# Patient Record
Sex: Female | Born: 1975 | Race: Black or African American | Hispanic: No | Smoking: Never smoker
Health system: Southern US, Community
[De-identification: ages and names within clinical notes are randomized; demographics above are authoritative.]

## PROBLEM LIST (undated history)

## (undated) DIAGNOSIS — F32A Depression, unspecified: Secondary | ICD-10-CM

## (undated) DIAGNOSIS — Z789 Other specified health status: Secondary | ICD-10-CM

## (undated) DIAGNOSIS — F419 Anxiety disorder, unspecified: Secondary | ICD-10-CM

## (undated) DIAGNOSIS — R011 Cardiac murmur, unspecified: Secondary | ICD-10-CM

## (undated) HISTORY — PX: NO PAST SURGERIES: SHX2092

## (undated) HISTORY — DX: Anxiety disorder, unspecified: F41.9

## (undated) HISTORY — DX: Other specified health status: Z78.9

## (undated) HISTORY — DX: Depression, unspecified: F32.A

## (undated) HISTORY — DX: Cardiac murmur, unspecified: R01.1

---

## 2007-05-14 ENCOUNTER — Emergency Department (HOSPITAL_COMMUNITY): Admission: EM | Admit: 2007-05-14 | Discharge: 2007-05-14 | Payer: Self-pay | Admitting: Emergency Medicine

## 2008-08-22 ENCOUNTER — Encounter: Admission: RE | Admit: 2008-08-22 | Discharge: 2008-08-22 | Payer: Self-pay | Admitting: Family Medicine

## 2010-03-28 ENCOUNTER — Encounter: Admission: RE | Admit: 2010-03-28 | Discharge: 2010-03-28 | Payer: Self-pay | Admitting: Family Medicine

## 2012-02-22 ENCOUNTER — Other Ambulatory Visit: Payer: Self-pay | Admitting: Family Medicine

## 2012-02-22 DIAGNOSIS — Z1231 Encounter for screening mammogram for malignant neoplasm of breast: Secondary | ICD-10-CM

## 2012-03-07 ENCOUNTER — Ambulatory Visit
Admission: RE | Admit: 2012-03-07 | Discharge: 2012-03-07 | Disposition: A | Discharge status: Home / Self Care | Payer: Medicaid Other | Source: Ambulatory Visit | Attending: Family Medicine | Admitting: Family Medicine

## 2012-03-07 DIAGNOSIS — Z1231 Encounter for screening mammogram for malignant neoplasm of breast: Secondary | ICD-10-CM

## 2015-06-11 LAB — OB RESULTS CONSOLE ABO/RH: RH Type: POSITIVE

## 2015-06-11 LAB — OB RESULTS CONSOLE HIV ANTIBODY (ROUTINE TESTING): HIV: NONREACTIVE

## 2015-06-11 LAB — OB RESULTS CONSOLE ANTIBODY SCREEN: ANTIBODY SCREEN: NEGATIVE

## 2015-06-11 LAB — OB RESULTS CONSOLE HGB/HCT, BLOOD
HCT: 38 %
Hemoglobin: 12.9 g/dL

## 2015-06-11 LAB — OB RESULTS CONSOLE PLATELET COUNT: PLATELETS: 411 10*3/uL

## 2015-06-11 LAB — OB RESULTS CONSOLE RPR: RPR: NONREACTIVE

## 2015-06-11 LAB — OB RESULTS CONSOLE HEPATITIS B SURFACE ANTIGEN: HEP B S AG: NEGATIVE

## 2015-06-11 LAB — OB RESULTS CONSOLE RUBELLA ANTIBODY, IGM: RUBELLA: IMMUNE

## 2015-07-01 ENCOUNTER — Encounter: Payer: Self-pay | Admitting: Obstetrics

## 2015-07-01 ENCOUNTER — Ambulatory Visit (INDEPENDENT_AMBULATORY_CARE_PROVIDER_SITE_OTHER): Payer: BLUE CROSS/BLUE SHIELD | Admitting: Obstetrics

## 2015-07-01 VITALS — BP 111/72 | HR 83 | Temp 99.2°F | Ht 65.5 in | Wt 233.0 lb

## 2015-07-01 DIAGNOSIS — O09521 Supervision of elderly multigravida, first trimester: Secondary | ICD-10-CM

## 2015-07-01 LAB — POCT URINALYSIS DIPSTICK
Bilirubin, UA: NEGATIVE
Glucose, UA: NEGATIVE
KETONES UA: NEGATIVE
LEUKOCYTES UA: NEGATIVE
Nitrite, UA: NEGATIVE
PH UA: 7
PROTEIN UA: NEGATIVE
RBC UA: NEGATIVE
Spec Grav, UA: 1.01
Urobilinogen, UA: NEGATIVE

## 2015-07-01 NOTE — Progress Notes (Signed)
Subjective:    Barbara Morales is being seen today for her first obstetrical visit.  This is a planned pregnancy. She is at [redacted]w[redacted]d gestation. Her obstetrical history is significant for advanced maternal age. Relationship with FOB: spouse, living together. Patient does intend to breast feed. Pregnancy history fully reviewed.  The information documented in the HPI was reviewed and verified.  Menstrual History: OB History    Gravida Para Term Preterm AB TAB SAB Ectopic Multiple Living   4 3 3       3        Patient's last menstrual period was 04/10/2015.    Past Medical History  Diagnosis Date  . Medical history non-contributory     Past Surgical History  Procedure Laterality Date  . No past surgeries       (Not in a hospital admission) Allergies  Allergen Reactions  . Penicillins Hives    Social History  Substance Use Topics  . Smoking status: Never Smoker   . Smokeless tobacco: Never Used  . Alcohol Use: No    History reviewed. No pertinent family history.   Review of Systems Constitutional: negative for weight loss Gastrointestinal: negative for vomiting Genitourinary:negative for genital lesions and vaginal discharge and dysuria Musculoskeletal:negative for back pain Behavioral/Psych: negative for abusive relationship, depression, illegal drug usage and tobacco use    Objective:    BP 111/72 mmHg  Pulse 83  Temp(Src) 99.2 F (37.3 C)  Ht 5' 5.5" (1.664 m)  Wt 233 lb (105.688 kg)  BMI 38.17 kg/m2  LMP 04/10/2015 General Appearance:    Alert, cooperative, no distress, appears stated age  Head:    Normocephalic, without obvious abnormality, atraumatic  Eyes:    PERRL, conjunctiva/corneas clear, EOM's intact, fundi    benign, both eyes  Ears:    Normal TM's and external ear canals, both ears  Nose:   Nares normal, septum midline, mucosa normal, no drainage    or sinus tenderness  Throat:   Lips, mucosa, and tongue normal; teeth and gums normal  Neck:   Supple,  symmetrical, trachea midline, no adenopathy;    thyroid:  no enlargement/tenderness/nodules; no carotid   bruit or JVD  Back:     Symmetric, no curvature, ROM normal, no CVA tenderness  Lungs:     Clear to auscultation bilaterally, respirations unlabored  Chest Wall:    No tenderness or deformity   Heart:    Regular rate and rhythm, S1 and S2 normal, no murmur, rub   or gallop  Breast Exam:    No tenderness, masses, or nipple abnormality  Abdomen:     Soft, non-tender, bowel sounds active all four quadrants,    no masses, no organomegaly  Genitalia:    Normal female without lesion, discharge or tenderness  Extremities:   Extremities normal, atraumatic, no cyanosis or edema  Pulses:   2+ and symmetric all extremities  Skin:   Skin color, texture, turgor normal, no rashes or lesions  Lymph nodes:   Cervical, supraclavicular, and axillary nodes normal  Neurologic:   CNII-XII intact, normal strength, sensation and reflexes    throughout      Lab Review Urine pregnancy test Labs reviewed yes Radiologic studies reviewed no   Assessment:    Pregnancy at [redacted]w[redacted]d weeks    AMA   Plan:    Referred to MFM for Genetic Counseling and Fetal Nuchal Fold Measurement and Ultrasound  Prenatal vitamins.  Counseling provided regarding continued use of seat belts, cessation of alcohol  consumption, smoking or use of illicit drugs; infection precautions i.e., influenza/TDAP immunizations, toxoplasmosis,CMV, parvovirus, listeria and varicella; workplace safety, exercise during pregnancy; routine dental care, safe medications, sexual activity, hot tubs, saunas, pools, travel, caffeine use, fish and methlymercury, potential toxins, hair treatments, varicose veins Weight gain recommendations per IOM guidelines reviewed: underweight/BMI< 18.5--> gain 28 - 40 lbs; normal weight/BMI 18.5 - 24.9--> gain 25 - 35 lbs; overweight/BMI 25 - 29.9--> gain 15 - 25 lbs; obese/BMI >30->gain  11 - 20 lbs Problem list  reviewed and updated. FIRST/CF mutation testing/NIPT/QUAD SCREEN/fragile X/Ashkenazi Jewish population testing/Spinal muscular atrophy discussed: requested. Role of ultrasound in pregnancy discussed; fetal survey: requested. Amniocentesis discussed: not indicated. VBAC calculator score: VBAC consent form provided  Meds ordered this encounter  Medications  . Prenatal Vit-Fe Fumarate-FA (PRENATAL VITAMINS PLUS) 27-1 MG TABS    Sig: Take by mouth.  . Doxylamine-Pyridoxine (DICLEGIS) 10-10 MG TBEC    Sig: Take by mouth.   Orders Placed This Encounter  Procedures  . SureSwab, Vaginosis/Vaginitis Plus  . [place] [facility] Fetal Nuchal Translucency    Standing Status: Future     Number of Occurrences:      Standing Expiration Date: 08/28/2016    Order Specific Question:  Reason for Exam (SYMPTOM  OR DIAGNOSIS REQUIRED)    Answer:  AMA    Order Specific Question:  Preferred imaging location?    Answer:  [facility]  . [place] [facility] OB Comp Less 14 Wks    Standing Status: Future     Number of Occurrences:      Standing Expiration Date: 08/28/2016    Order Specific Question:  Reason for Exam (SYMPTOM  OR DIAGNOSIS REQUIRED)    Answer:  AMA    Order Specific Question:  Preferred imaging location?    Answer:  [facility]  . AMB referral to maternal fetal medicine    Referral Priority:  Routine    Referral Type:  Consultation    Referral Reason:  Specialty Services Required    Number of Visits Requested:  1  . AMB MFM GENETICS REFERRAL    Referral Priority:  Routine    Referral Type:  Consultation    Referral Reason:  Specialty Services Required    Number of Visits Requested:  1    Follow up in 4 weeks.

## 2015-07-01 NOTE — Addendum Note (Signed)
Addended by: Carole Binning on: 07/01/2015 04:40 PM   Modules accepted: Orders

## 2015-07-04 ENCOUNTER — Other Ambulatory Visit: Payer: Self-pay | Admitting: Obstetrics

## 2015-07-04 LAB — SURESWAB, VAGINOSIS/VAGINITIS PLUS
Atopobium vaginae: NOT DETECTED Log (cells/mL)
C. ALBICANS, DNA: NOT DETECTED
C. GLABRATA, DNA: NOT DETECTED
C. TROPICALIS, DNA: NOT DETECTED
C. parapsilosis, DNA: DETECTED — AB
C. trachomatis RNA, TMA: NOT DETECTED
LACTOBACILLUS SPECIES: 7.7 Log (cells/mL)
MEGASPHAERA SPECIES: NOT DETECTED Log (cells/mL)
N. gonorrhoeae RNA, TMA: NOT DETECTED
T. VAGINALIS RNA, QL TMA: NOT DETECTED

## 2015-07-08 ENCOUNTER — Encounter (HOSPITAL_COMMUNITY): Payer: Self-pay

## 2015-07-08 ENCOUNTER — Ambulatory Visit (HOSPITAL_COMMUNITY)
Admission: RE | Admit: 2015-07-08 | Discharge: 2015-07-08 | Disposition: A | Discharge status: Home / Self Care | Payer: BLUE CROSS/BLUE SHIELD | Source: Ambulatory Visit | Attending: Obstetrics | Admitting: Obstetrics

## 2015-07-08 DIAGNOSIS — Z3A13 13 weeks gestation of pregnancy: Secondary | ICD-10-CM | POA: Insufficient documentation

## 2015-07-08 DIAGNOSIS — O09521 Supervision of elderly multigravida, first trimester: Secondary | ICD-10-CM | POA: Insufficient documentation

## 2015-07-08 DIAGNOSIS — Z36 Encounter for antenatal screening of mother: Secondary | ICD-10-CM | POA: Diagnosis not present

## 2015-07-08 DIAGNOSIS — O09529 Supervision of elderly multigravida, unspecified trimester: Secondary | ICD-10-CM

## 2015-07-09 DIAGNOSIS — O09529 Supervision of elderly multigravida, unspecified trimester: Secondary | ICD-10-CM | POA: Insufficient documentation

## 2015-07-09 NOTE — Progress Notes (Signed)
Genetic Counseling  High-Risk Gestation Note  Appointment Date:  07/08/2015 Referred By: Shelly Bombard, MD Date of Birth:  Sep 22, 1975 Partner:  Barbara Morales   Pregnancy HistoryWW:[id number] Estimated Date of Delivery: 01/15/16 Estimated Gestational Age: [redacted]w[redacted]d Attending: [place], MD   Mrs. Barbara Morales and her husband, Mr. Barbara Morales, were seen for genetic counseling because of a maternal age of 40 y.o..     In Summary:   Discussed approximate 1 in 29 risk for fetal aneuploidy related to maternal age  Nuchal translucency ultrasound attempted today; Results reported separately  Patient elected to pursue NIPS (Panorama) today; declined maternal serum screening and CVS/amniocentesis  Detailed ultrasound scheduled for 08/13/15  Family history significant for maternal great-grandfather with sickle cell anemia; Patient declined hemoglobin electrophoresis today.   They were counseled regarding maternal age and the association with risk for chromosome conditions due to nondisjunction with aging of the ova.  We reviewed chromosomes, nondisjunction, and the associated 1 in 1 risk for fetal aneuploidy related to a maternal age of 40 y.o. at [redacted]w[redacted]d gestation. They were counseled that the risk for aneuploidy decreases as gestational age increases, accounting for those pregnancies which spontaneously abort.  We specifically discussed Down syndrome (trisomy 28), trisomies 53 and 18, and sex chromosome aneuploidies (47,XXX and 47,XXY) including the common features and prognoses of each.   We reviewed available screening options including First Screen, Quad screen, noninvasive prenatal screening (NIPS)/cell free DNA (cfDNA) testing, and detailed ultrasound. They were counseled that screening tests are used to modify a patient's a priori risk for aneuploidy, typically based on age. This estimate provides a pregnancy specific risk assessment. We reviewed the benefits and limitations of each option.  Specifically, we discussed the conditions for which each test screens, the detection rates, and false positive rates of each. They were also counseled regarding diagnostic testing via CVS and amniocentesis. We reviewed the approximate 1 in [id number] risk for complications for amniocentesis, including spontaneous pregnancy loss. After consideration of all the options, she elected to proceed with NIPS (Panorama through [facility] laboratory).  Those results will be available in 8-10 days.  She declined first screen, Quad screen, and CVS/amniocentesis.   Nuchal translucency ultrasound was attempted today.  The report will be documented separately.  Detailed ultrasound is scheduled for 08/13/15.  They understand that screening tests cannot rule out all birth defects or genetic syndromes. The patient was advised of this limitation and states she still does not want additional testing at this time.   Both family histories were reviewed and found to be contributory for sickle cell anemia for the patient's maternal great-grandfather. She reported no additional relatives with sickle cell anemia and was not sure of her personal sickle cell carrier status. We discussed that given this reported family history, her chance to have sickle cell trait is 1 in 4. The father of the pregnancy reported no known family history of sickle cell disease or sickle cell trait.  Mrs. Barbara Morales was provided with written information regarding sickle cell anemia (SCA) including the carrier frequency and incidence in the African-American population, the availability of carrier testing and prenatal diagnosis if indicated.  In addition, we discussed that hemoglobinopathies are routinely screened for as part of the [place] newborn screening panel.  She declined hemoglobin electrophoresis today. Without further information regarding the provided family history, an accurate genetic risk cannot be calculated. Further genetic counseling is warranted if more  information is obtained.  Mrs. Barbara Morales denied exposure to environmental  toxins or chemical agents. She denied the use of alcohol, tobacco or street drugs. She denied significant viral illnesses during the course of her pregnancy. Her medical and surgical histories were noncontributory.   I counseled this couple regarding the above risks and available options.  The approximate face-to-face time with the genetic counselor was 40 minutes.  Chipper Oman, MS,  Certified Genetic Counselor 07/09/2015

## 2015-07-10 ENCOUNTER — Encounter: Payer: Self-pay | Admitting: *Deleted

## 2015-07-10 DIAGNOSIS — O09521 Supervision of elderly multigravida, first trimester: Secondary | ICD-10-CM

## 2015-07-16 ENCOUNTER — Other Ambulatory Visit (HOSPITAL_COMMUNITY): Payer: Self-pay

## 2015-07-16 ENCOUNTER — Telehealth (HOSPITAL_COMMUNITY): Payer: Self-pay | Admitting: MS"

## 2015-07-16 NOTE — Telephone Encounter (Signed)
Called Barbara Morales to discuss her prenatal cell free DNA test results.  Mrs. Barbara Morales had Panorama testing through [facility] [facility].  Testing was offered because of maternal age.   The patient was identified by name and DOB.  We reviewed that these are within normal limits, showing a less than 1 in 10,000 risk for trisomies 21, 18 and 13, and monosomy X (Turner syndrome).  In addition, the risk for triploidy/vanishing twin and sex chromosome trisomies (47,XXX and 47,XXY) was also low risk.  We reviewed that this testing identifies > 99% of pregnancies with trisomy 72, trisomy 47, sex chromosome trisomies (47,XXX and 47,XXY), and triploidy. The detection rate for trisomy 18 is 96%.  The detection rate for monosomy X is ~92%.  The false positive rate is <0.1% for all conditions. Testing was also consistent with female fetal sex.  The patient did wish to know fetal sex.  She understands that this testing does not identify all genetic conditions.  All questions were answered to her satisfaction, she was encouraged to call with additional questions or concerns.  Chipper Oman, MS Certified Genetic Counselor 07/16/2015 12:39 PM

## 2015-07-22 ENCOUNTER — Ambulatory Visit (INDEPENDENT_AMBULATORY_CARE_PROVIDER_SITE_OTHER): Payer: BLUE CROSS/BLUE SHIELD | Admitting: Obstetrics

## 2015-07-22 VITALS — BP 104/73 | HR 82 | Temp 99.0°F | Wt 233.0 lb

## 2015-07-22 DIAGNOSIS — Z3492 Encounter for supervision of normal pregnancy, unspecified, second trimester: Secondary | ICD-10-CM

## 2015-07-22 LAB — POCT URINALYSIS DIPSTICK
BILIRUBIN UA: NEGATIVE
Glucose, UA: NEGATIVE
KETONES UA: NEGATIVE
Leukocytes, UA: NEGATIVE
Nitrite, UA: NEGATIVE
PH UA: 7
Protein, UA: NEGATIVE
RBC UA: NEGATIVE
Spec Grav, UA: 1.015
Urobilinogen, UA: NEGATIVE

## 2015-07-22 NOTE — Progress Notes (Signed)
Patient is doing some better with her nausea.

## 2015-07-23 ENCOUNTER — Encounter: Payer: Self-pay | Admitting: Obstetrics

## 2015-07-23 NOTE — Progress Notes (Signed)
  Subjective:    Barbara Morales is a 40 y.o. female being seen today for her obstetrical visit. She is at [redacted]w[redacted]d gestation. Patient reports: no complaints.  Problem List Items Addressed This Visit    None    Visit Diagnoses    Prenatal care, second trimester    -  Primary    Relevant Orders    POCT urinalysis dipstick (Completed)      Patient Active Problem List   Diagnosis Date Noted  . Advanced maternal age in multigravida 07/09/2015    Objective:     BP 104/73 mmHg  Pulse 82  Temp(Src) 99 F (37.2 C)  Wt 233 lb (105.688 kg)  LMP 04/10/2015 Uterine Size: Below umbilicus     Assessment:    Pregnancy @ [redacted]w[redacted]d  weeks Doing well    Plan:    Problem list reviewed and updated. Labs reviewed.  Follow up in 4 weeks. FIRST/CF mutation testing/NIPT/QUAD SCREEN/fragile X/Ashkenazi Jewish population testing/Spinal muscular atrophy discussed: requested. Role of ultrasound in pregnancy discussed; fetal survey: requested. Amniocentesis discussed: not indicated.

## 2015-07-24 ENCOUNTER — Telehealth: Payer: Self-pay | Admitting: *Deleted

## 2015-07-24 NOTE — Telephone Encounter (Signed)
Diclegis Rx has been approved via Medicaid.  Pharmacy aware.

## 2015-08-13 ENCOUNTER — Encounter (HOSPITAL_COMMUNITY): Payer: Self-pay

## 2015-08-13 ENCOUNTER — Ambulatory Visit (HOSPITAL_COMMUNITY)
Admission: RE | Admit: 2015-08-13 | Discharge: 2015-08-13 | Disposition: A | Discharge status: Home / Self Care | Payer: BLUE CROSS/BLUE SHIELD | Source: Ambulatory Visit | Attending: Specialist | Admitting: Specialist

## 2015-08-13 ENCOUNTER — Other Ambulatory Visit (HOSPITAL_COMMUNITY): Payer: Self-pay | Admitting: Obstetrics and Gynecology

## 2015-08-13 DIAGNOSIS — Z3A17 17 weeks gestation of pregnancy: Secondary | ICD-10-CM

## 2015-08-13 DIAGNOSIS — Z36 Encounter for antenatal screening of mother: Secondary | ICD-10-CM | POA: Diagnosis present

## 2015-08-13 DIAGNOSIS — O99212 Obesity complicating pregnancy, second trimester: Secondary | ICD-10-CM | POA: Insufficient documentation

## 2015-08-13 DIAGNOSIS — Z1389 Encounter for screening for other disorder: Secondary | ICD-10-CM

## 2015-08-13 DIAGNOSIS — O09522 Supervision of elderly multigravida, second trimester: Secondary | ICD-10-CM | POA: Insufficient documentation

## 2015-08-13 DIAGNOSIS — O09529 Supervision of elderly multigravida, unspecified trimester: Secondary | ICD-10-CM

## 2015-08-13 DIAGNOSIS — O09523 Supervision of elderly multigravida, third trimester: Secondary | ICD-10-CM

## 2015-08-14 ENCOUNTER — Other Ambulatory Visit (HOSPITAL_COMMUNITY): Payer: Self-pay | Admitting: *Deleted

## 2015-08-14 DIAGNOSIS — Z0489 Encounter for examination and observation for other specified reasons: Secondary | ICD-10-CM

## 2015-08-14 DIAGNOSIS — IMO0002 Reserved for concepts with insufficient information to code with codable children: Secondary | ICD-10-CM

## 2015-08-19 ENCOUNTER — Encounter: Payer: Self-pay | Admitting: Obstetrics

## 2015-08-19 ENCOUNTER — Ambulatory Visit (INDEPENDENT_AMBULATORY_CARE_PROVIDER_SITE_OTHER): Payer: BLUE CROSS/BLUE SHIELD | Admitting: Obstetrics

## 2015-08-19 VITALS — BP 104/69 | HR 99 | Temp 98.4°F | Wt 233.0 lb

## 2015-08-19 DIAGNOSIS — Z3482 Encounter for supervision of other normal pregnancy, second trimester: Secondary | ICD-10-CM

## 2015-08-19 LAB — POCT URINALYSIS DIPSTICK
BILIRUBIN UA: NEGATIVE
Glucose, UA: NEGATIVE
KETONES UA: NEGATIVE
Leukocytes, UA: NEGATIVE
Nitrite, UA: NEGATIVE
PH UA: 5
Protein, UA: NEGATIVE
RBC UA: NEGATIVE
Spec Grav, UA: 1.02
Urobilinogen, UA: NEGATIVE

## 2015-08-19 NOTE — Progress Notes (Signed)
Subjective:    Barbara Morales is a 40 y.o. female being seen today for her obstetrical visit. She is at [redacted]w[redacted]d gestation. Patient reports: no complaints . Fetal movement: normal.  Problem List Items Addressed This Visit    None    Visit Diagnoses    Supervision of other normal pregnancy, antepartum, second trimester    -  Primary    Relevant Orders    POCT urinalysis dipstick (Completed)      Patient Active Problem List   Diagnosis Date Noted  . Advanced maternal age in multigravida 07/09/2015   Objective:    BP 104/69 mmHg  Pulse 99  Temp(Src) 98.4 F (36.9 C)  Wt 233 lb (105.688 kg)  LMP 04/10/2015 FHT: 150 BPM  Uterine Size: size equals dates     Assessment:    Pregnancy @ [redacted]w[redacted]d    Plan:    Signs and symptoms of preterm labor: discussed.  Labs, problem list reviewed and updated 2 hr GTT planned Follow up in 4 weeks.

## 2015-09-10 ENCOUNTER — Other Ambulatory Visit: Payer: Self-pay | Admitting: Certified Nurse Midwife

## 2015-09-16 ENCOUNTER — Ambulatory Visit (INDEPENDENT_AMBULATORY_CARE_PROVIDER_SITE_OTHER): Payer: BLUE CROSS/BLUE SHIELD | Admitting: Obstetrics

## 2015-09-16 ENCOUNTER — Encounter: Payer: Self-pay | Admitting: Obstetrics

## 2015-09-16 VITALS — BP 111/72 | HR 84 | Wt 239.0 lb

## 2015-09-16 DIAGNOSIS — Z3492 Encounter for supervision of normal pregnancy, unspecified, second trimester: Secondary | ICD-10-CM

## 2015-09-16 LAB — POCT URINALYSIS DIPSTICK
BILIRUBIN UA: NEGATIVE
Glucose, UA: NEGATIVE
Ketones, UA: NEGATIVE
LEUKOCYTES UA: NEGATIVE
NITRITE UA: NEGATIVE
PH UA: 5
RBC UA: NEGATIVE
Spec Grav, UA: 1.025
Urobilinogen, UA: NEGATIVE

## 2015-09-16 NOTE — Progress Notes (Signed)
Subjective:    Barbara Morales is a 40 y.o. female being seen today for her obstetrical visit. She is at [redacted]w[redacted]d gestation. Patient reports: no complaints . Fetal movement: normal.  Problem List Items Addressed This Visit    None    Visit Diagnoses    Prenatal care, second trimester    -  Primary    Relevant Orders    POCT urinalysis dipstick (Completed)      Patient Active Problem List   Diagnosis Date Noted  . Advanced maternal age in multigravida 07/09/2015   Objective:    BP 111/72 mmHg  Pulse 84  Wt 239 lb (108.41 kg)  LMP 04/10/2015 FHT: 150 BPM  Uterine Size: size equals dates     Assessment:    Pregnancy @ [redacted]w[redacted]d    Plan:    Signs and symptoms of preterm labor: discussed.  Labs, problem list reviewed and updated 2 hr GTT planned Follow up in 4 weeks.

## 2015-09-16 NOTE — Progress Notes (Signed)
Patient states she had some real sharp pain yesterday that lasted about 5 min.

## 2015-09-23 ENCOUNTER — Ambulatory Visit (HOSPITAL_COMMUNITY)
Admission: RE | Admit: 2015-09-23 | Discharge: 2015-09-23 | Disposition: A | Discharge status: Home / Self Care | Payer: BLUE CROSS/BLUE SHIELD | Source: Ambulatory Visit | Attending: Obstetrics | Admitting: Obstetrics

## 2015-09-23 ENCOUNTER — Encounter (HOSPITAL_COMMUNITY): Payer: Self-pay

## 2015-09-23 VITALS — BP 112/65 | HR 83 | Wt 239.2 lb

## 2015-09-23 DIAGNOSIS — O99212 Obesity complicating pregnancy, second trimester: Secondary | ICD-10-CM | POA: Diagnosis not present

## 2015-09-23 DIAGNOSIS — Z0489 Encounter for examination and observation for other specified reasons: Secondary | ICD-10-CM

## 2015-09-23 DIAGNOSIS — O09529 Supervision of elderly multigravida, unspecified trimester: Secondary | ICD-10-CM

## 2015-09-23 DIAGNOSIS — Z3A23 23 weeks gestation of pregnancy: Secondary | ICD-10-CM | POA: Insufficient documentation

## 2015-09-23 DIAGNOSIS — O4402 Placenta previa specified as without hemorrhage, second trimester: Secondary | ICD-10-CM | POA: Insufficient documentation

## 2015-09-23 DIAGNOSIS — O09522 Supervision of elderly multigravida, second trimester: Secondary | ICD-10-CM | POA: Diagnosis not present

## 2015-09-23 DIAGNOSIS — IMO0002 Reserved for concepts with insufficient information to code with codable children: Secondary | ICD-10-CM

## 2015-10-14 ENCOUNTER — Encounter: Payer: BLUE CROSS/BLUE SHIELD | Admitting: Obstetrics

## 2015-10-14 ENCOUNTER — Ambulatory Visit (INDEPENDENT_AMBULATORY_CARE_PROVIDER_SITE_OTHER): Payer: BLUE CROSS/BLUE SHIELD | Admitting: Obstetrics

## 2015-10-14 VITALS — BP 115/70 | HR 95 | Temp 98.1°F

## 2015-10-14 DIAGNOSIS — Z3492 Encounter for supervision of normal pregnancy, unspecified, second trimester: Secondary | ICD-10-CM

## 2015-10-14 DIAGNOSIS — O09521 Supervision of elderly multigravida, first trimester: Secondary | ICD-10-CM

## 2015-10-14 LAB — POCT URINALYSIS DIPSTICK
Bilirubin, UA: NEGATIVE
GLUCOSE UA: NEGATIVE
Ketones, UA: NEGATIVE
Leukocytes, UA: NEGATIVE
NITRITE UA: NEGATIVE
SPEC GRAV UA: 1.015
Urobilinogen, UA: NEGATIVE
pH, UA: 6

## 2015-10-14 NOTE — Progress Notes (Signed)
Patient has concerns about placenta moving

## 2015-10-15 ENCOUNTER — Encounter: Payer: Self-pay | Admitting: Obstetrics

## 2015-10-15 NOTE — Progress Notes (Signed)
Subjective:    Barbara Morales is a 40 y.o. female being seen today for her obstetrical visit. She is at [redacted]w[redacted]d gestation. Patient reports: no complaints . Fetal movement: normal.  Problem List Items Addressed This Visit    None    Visit Diagnoses    Prenatal care, second trimester    -  Primary    Relevant Orders    POCT urinalysis dipstick (Completed)      Patient Active Problem List   Diagnosis Date Noted  . Advanced maternal age in multigravida 07/09/2015   Objective:    BP 115/70 mmHg  Pulse 95  Temp(Src) 98.1 F (36.7 C)  LMP 04/10/2015 FHT: 150 BPM  Uterine Size: size equals dates     Assessment:    Pregnancy @ [redacted]w[redacted]d    Plan:    OBGCT: ordered for next visit. Signs and symptoms of preterm labor: discussed.  Labs, problem list reviewed and updated 2 hr GTT planned Follow up in 2 weeks.

## 2015-10-28 ENCOUNTER — Other Ambulatory Visit: Payer: BLUE CROSS/BLUE SHIELD

## 2015-10-28 ENCOUNTER — Ambulatory Visit (INDEPENDENT_AMBULATORY_CARE_PROVIDER_SITE_OTHER): Payer: BLUE CROSS/BLUE SHIELD | Admitting: Obstetrics

## 2015-10-28 VITALS — BP 117/68 | HR 91 | Temp 98.7°F | Wt 244.0 lb

## 2015-10-28 DIAGNOSIS — Z3493 Encounter for supervision of normal pregnancy, unspecified, third trimester: Secondary | ICD-10-CM

## 2015-10-28 LAB — POCT URINALYSIS DIPSTICK
Bilirubin, UA: NEGATIVE
Blood, UA: NEGATIVE
Glucose, UA: NEGATIVE
KETONES UA: NEGATIVE
LEUKOCYTES UA: NEGATIVE
Nitrite, UA: NEGATIVE
Spec Grav, UA: 1.015
UROBILINOGEN UA: NEGATIVE
pH, UA: 6

## 2015-10-29 ENCOUNTER — Encounter: Payer: Self-pay | Admitting: Obstetrics

## 2015-10-29 LAB — CBC
Hematocrit: 35.4 % (ref 34.0–46.6)
Hemoglobin: 11.9 g/dL (ref 11.1–15.9)
MCH: 27.9 pg (ref 26.6–33.0)
MCHC: 33.6 g/dL (ref 31.5–35.7)
MCV: 83 fL (ref 79–97)
Platelets: 347 10*3/uL (ref 150–379)
RBC: 4.27 x10E6/uL (ref 3.77–5.28)
RDW: 14.2 % (ref 12.3–15.4)
WBC: 11.2 10*3/uL — AB (ref 3.4–10.8)

## 2015-10-29 LAB — GLUCOSE TOLERANCE, 2 HOURS W/ 1HR
GLUCOSE, 2 HOUR: 101 mg/dL (ref 65–152)
GLUCOSE, FASTING: 77 mg/dL (ref 65–91)
Glucose, 1 hour: 123 mg/dL (ref 65–179)

## 2015-10-29 LAB — RPR: RPR: NONREACTIVE

## 2015-10-29 LAB — HIV ANTIBODY (ROUTINE TESTING W REFLEX): HIV SCREEN 4TH GENERATION: NONREACTIVE

## 2015-10-29 NOTE — Progress Notes (Signed)
Subjective:    Barbara Morales is a 40 y.o. female being seen today for her obstetrical visit. She is at [redacted]w[redacted]d gestation. Patient reports no complaints. Fetal movement: normal.  Problem List Items Addressed This Visit    None    Visit Diagnoses    Prenatal care in third trimester    -  Primary    Relevant Orders    POCT Urinalysis Dipstick (Completed)      Patient Active Problem List   Diagnosis Date Noted  . Advanced maternal age in multigravida 07/09/2015   Objective:    BP 117/68 mmHg  Pulse 91  Temp(Src) 98.7 F (37.1 C)  Wt 244 lb (110.678 kg)  LMP 04/10/2015 FHT:  150 BPM  Uterine Size: size equals dates  Presentation: unsure     Assessment:    Pregnancy @ [redacted]w[redacted]d weeks   Plan:     labs reviewed, problem list updated Consent signed. GBS sent TDAP offered  Rhogam given for RH negative Pediatrician: discussed. Infant feeding: plans to breastfeed. Maternity leave: not discussed. Cigarette smoking: never smoked. Orders Placed This Encounter  Procedures  . POCT Urinalysis Dipstick   No orders of the defined types were placed in this encounter.   Follow up in 2 Weeks.

## 2015-11-05 ENCOUNTER — Ambulatory Visit (HOSPITAL_COMMUNITY)
Admission: RE | Admit: 2015-11-05 | Discharge: 2015-11-05 | Disposition: A | Discharge status: Home / Self Care | Payer: BLUE CROSS/BLUE SHIELD | Source: Ambulatory Visit | Attending: Obstetrics | Admitting: Obstetrics

## 2015-11-05 ENCOUNTER — Other Ambulatory Visit (HOSPITAL_COMMUNITY): Payer: Self-pay | Admitting: Maternal and Fetal Medicine

## 2015-11-05 DIAGNOSIS — O09523 Supervision of elderly multigravida, third trimester: Secondary | ICD-10-CM

## 2015-11-05 DIAGNOSIS — Z3A29 29 weeks gestation of pregnancy: Secondary | ICD-10-CM | POA: Diagnosis not present

## 2015-11-05 DIAGNOSIS — O99213 Obesity complicating pregnancy, third trimester: Secondary | ICD-10-CM

## 2015-11-05 DIAGNOSIS — O4403 Placenta previa specified as without hemorrhage, third trimester: Secondary | ICD-10-CM

## 2015-11-05 DIAGNOSIS — O09529 Supervision of elderly multigravida, unspecified trimester: Secondary | ICD-10-CM

## 2015-11-11 ENCOUNTER — Ambulatory Visit (INDEPENDENT_AMBULATORY_CARE_PROVIDER_SITE_OTHER): Payer: BLUE CROSS/BLUE SHIELD | Admitting: Obstetrics

## 2015-11-11 ENCOUNTER — Encounter: Payer: BLUE CROSS/BLUE SHIELD | Admitting: Obstetrics

## 2015-11-11 VITALS — BP 105/63 | HR 90 | Temp 98.4°F | Wt 243.0 lb

## 2015-11-11 DIAGNOSIS — Z3493 Encounter for supervision of normal pregnancy, unspecified, third trimester: Secondary | ICD-10-CM

## 2015-11-11 LAB — POCT URINALYSIS DIPSTICK
BILIRUBIN UA: NEGATIVE
GLUCOSE UA: NEGATIVE
Ketones, UA: NEGATIVE
Leukocytes, UA: NEGATIVE
Nitrite, UA: NEGATIVE
RBC UA: NEGATIVE
SPEC GRAV UA: 1.02
Urobilinogen, UA: NEGATIVE
pH, UA: 6

## 2015-11-13 ENCOUNTER — Telehealth: Payer: Self-pay | Admitting: *Deleted

## 2015-11-13 NOTE — Telephone Encounter (Signed)
Patient wants to know if she can get a Rx for a donut pillow- she is having a lot of pain on her tailbone at work. Will her insurance pay for that?

## 2015-11-14 ENCOUNTER — Encounter: Payer: Self-pay | Admitting: Obstetrics

## 2015-11-14 NOTE — Progress Notes (Signed)
Subjective:    Barbara Morales is a 40 y.o. female being seen today for her obstetrical visit. She is at [redacted]w[redacted]d gestation. Patient reports no complaints. Fetal movement: normal.  Problem List Items Addressed This Visit    None    Visit Diagnoses    Prenatal care, third trimester    -  Primary    Relevant Orders    POCT urinalysis dipstick (Completed)      Patient Active Problem List   Diagnosis Date Noted  . Advanced maternal age in multigravida 07/09/2015   Objective:    BP 105/63 mmHg  Pulse 90  Temp(Src) 98.4 F (36.9 C)  Wt 243 lb (110.224 kg)  LMP 04/10/2015 FHT:  150 BPM  Uterine Size: size equals dates  Presentation: unsure     Assessment:    Pregnancy @ [redacted]w[redacted]d weeks   Plan:     labs reviewed, problem list updated Consent signed. GBS sent TDAP offered  Rhogam given for RH negative Pediatrician: discussed. Infant feeding: plans to breastfeed. Maternity leave: discussed. Cigarette smoking: never smoked. Orders Placed This Encounter  Procedures  . POCT urinalysis dipstick   No orders of the defined types were placed in this encounter.   Follow up in 2 Weeks.

## 2015-11-14 NOTE — Telephone Encounter (Signed)
Patient notified- Rx written

## 2015-11-14 NOTE — Telephone Encounter (Signed)
OK for pillow.

## 2015-11-18 ENCOUNTER — Other Ambulatory Visit: Payer: Self-pay | Admitting: *Deleted

## 2015-11-18 DIAGNOSIS — O09523 Supervision of elderly multigravida, third trimester: Secondary | ICD-10-CM

## 2015-11-18 MED ORDER — CITRANATAL HARMONY 27-1-260 MG PO CAPS: 1.0000 | ORAL_CAPSULE | Freq: Every day | ORAL | Status: DC

## 2015-11-25 ENCOUNTER — Encounter: Payer: Self-pay | Admitting: Obstetrics

## 2015-11-25 ENCOUNTER — Ambulatory Visit (INDEPENDENT_AMBULATORY_CARE_PROVIDER_SITE_OTHER): Payer: BLUE CROSS/BLUE SHIELD | Admitting: Obstetrics

## 2015-11-25 VITALS — BP 111/69 | HR 87 | Temp 98.9°F | Wt 247.0 lb

## 2015-11-25 DIAGNOSIS — Z3493 Encounter for supervision of normal pregnancy, unspecified, third trimester: Secondary | ICD-10-CM

## 2015-11-25 DIAGNOSIS — J0100 Acute maxillary sinusitis, unspecified: Secondary | ICD-10-CM

## 2015-11-25 DIAGNOSIS — J301 Allergic rhinitis due to pollen: Secondary | ICD-10-CM

## 2015-11-25 LAB — POCT URINALYSIS DIPSTICK
BILIRUBIN UA: NEGATIVE
Blood, UA: NEGATIVE
Glucose, UA: NEGATIVE
KETONES UA: NEGATIVE
LEUKOCYTES UA: NEGATIVE
Nitrite, UA: NEGATIVE
Protein, UA: NEGATIVE
Urobilinogen, UA: NEGATIVE
pH, UA: 7

## 2015-11-25 MED ORDER — AZITHROMYCIN 250 MG PO TABS: ORAL_TABLET | ORAL | Status: DC

## 2015-11-25 MED ORDER — LORATADINE 10 MG PO TABS: 10.0000 mg | ORAL_TABLET | Freq: Every day | ORAL | Status: DC

## 2015-11-25 NOTE — Addendum Note (Signed)
Addended by: Clarnce Flock E on: 11/25/2015 01:23 PM   Modules accepted: Orders

## 2015-11-25 NOTE — Progress Notes (Signed)
Subjective:    Barbara Morales is a 40 y.o. female being seen today for her obstetrical visit. She is at [redacted]w[redacted]d gestation. Patient reports no complaints. Fetal movement: normal.  Problem List Items Addressed This Visit    None    Visit Diagnoses    Acute maxillary sinusitis, recurrence not specified        Relevant Medications    azithromycin (ZITHROMAX) 250 MG tablet    loratadine (CLARITIN) 10 MG tablet    Allergic rhinitis due to pollen        Relevant Medications    loratadine (CLARITIN) 10 MG tablet      Patient Active Problem List   Diagnosis Date Noted  . Advanced maternal age in multigravida 07/09/2015   Objective:    BP 111/69 mmHg  Pulse 87  Temp(Src) 98.9 F (37.2 C)  Wt 247 lb (112.038 kg)  LMP 04/10/2015 FHT:  150 BPM  Uterine Size: size equals dates  Presentation: unsure     Assessment:    Pregnancy @ [redacted]w[redacted]d weeks   Plan:     labs reviewed, problem list updated Consent signed. GBS sent TDAP offered  Rhogam given for RH negative Pediatrician: discussed. Infant feeding: plans to breastfeed. Maternity leave: discussed. Cigarette smoking: never smoked. No orders of the defined types were placed in this encounter.   Meds ordered this encounter  Medications  . azithromycin (ZITHROMAX) 250 MG tablet    Sig: Take 2 tabs po day 1, then take 1 tab po daily starting day 2.    Dispense:  15 tablet    Refill:  1  . loratadine (CLARITIN) 10 MG tablet    Sig: Take 1 tablet (10 mg total) by mouth daily.    Dispense:  30 tablet    Refill:  11   Follow up in 2 Weeks.

## 2015-12-09 ENCOUNTER — Ambulatory Visit (HOSPITAL_COMMUNITY)
Admission: RE | Admit: 2015-12-09 | Discharge: 2015-12-09 | Disposition: A | Discharge status: Home / Self Care | Payer: BLUE CROSS/BLUE SHIELD | Source: Ambulatory Visit | Attending: Obstetrics & Gynecology | Admitting: Obstetrics & Gynecology

## 2015-12-09 ENCOUNTER — Encounter (HOSPITAL_COMMUNITY): Payer: Self-pay

## 2015-12-09 ENCOUNTER — Other Ambulatory Visit (HOSPITAL_COMMUNITY): Payer: Self-pay | Admitting: Maternal and Fetal Medicine

## 2015-12-09 ENCOUNTER — Encounter: Payer: BLUE CROSS/BLUE SHIELD | Admitting: Obstetrics

## 2015-12-09 DIAGNOSIS — O09523 Supervision of elderly multigravida, third trimester: Secondary | ICD-10-CM

## 2015-12-09 DIAGNOSIS — O09529 Supervision of elderly multigravida, unspecified trimester: Secondary | ICD-10-CM

## 2015-12-09 DIAGNOSIS — Z3A29 29 weeks gestation of pregnancy: Secondary | ICD-10-CM

## 2015-12-09 DIAGNOSIS — Z3A34 34 weeks gestation of pregnancy: Secondary | ICD-10-CM | POA: Diagnosis present

## 2015-12-09 DIAGNOSIS — O99213 Obesity complicating pregnancy, third trimester: Secondary | ICD-10-CM | POA: Insufficient documentation

## 2015-12-09 DIAGNOSIS — E669 Obesity, unspecified: Secondary | ICD-10-CM | POA: Insufficient documentation

## 2015-12-09 DIAGNOSIS — O4403 Placenta previa specified as without hemorrhage, third trimester: Secondary | ICD-10-CM | POA: Insufficient documentation

## 2015-12-13 ENCOUNTER — Ambulatory Visit (INDEPENDENT_AMBULATORY_CARE_PROVIDER_SITE_OTHER): Payer: Self-pay | Admitting: Obstetrics

## 2015-12-13 ENCOUNTER — Encounter: Payer: Self-pay | Admitting: Obstetrics

## 2015-12-13 VITALS — BP 100/71 | HR 87 | Temp 99.1°F | Wt 249.5 lb

## 2015-12-13 DIAGNOSIS — O4403 Placenta previa specified as without hemorrhage, third trimester: Secondary | ICD-10-CM

## 2015-12-13 DIAGNOSIS — O4413 Placenta previa with hemorrhage, third trimester: Secondary | ICD-10-CM

## 2015-12-13 DIAGNOSIS — Z029 Encounter for administrative examinations, unspecified: Secondary | ICD-10-CM

## 2015-12-13 DIAGNOSIS — Z3483 Encounter for supervision of other normal pregnancy, third trimester: Secondary | ICD-10-CM

## 2015-12-13 LAB — POCT URINALYSIS DIPSTICK
Bilirubin, UA: NEGATIVE
Glucose, UA: NEGATIVE
KETONES UA: NEGATIVE
Nitrite, UA: NEGATIVE
RBC UA: NEGATIVE
Urobilinogen, UA: NEGATIVE
pH, UA: 7

## 2015-12-13 NOTE — Progress Notes (Signed)
Subjective:    Barbara Morales is a 40 y.o. female being seen today for her obstetrical visit. She is at [redacted]w[redacted]d gestation. Patient reports no complaints. Fetal movement: normal.  Problem List Items Addressed This Visit    None    Visit Diagnoses    Encounter for supervision of other normal pregnancy in third trimester    -  Primary    Relevant Orders    POCT urinalysis dipstick (Completed)    Strep Gp B NAA      Patient Active Problem List   Diagnosis Date Noted  . Advanced maternal age in multigravida 07/09/2015   Objective:    BP 100/71 mmHg  Pulse 87  Temp(Src) 99.1 F (37.3 C)  Wt 249 lb 8 oz (113.172 kg)  LMP 04/10/2015 FHT:  150 BPM  Uterine Size: size equals dates  Presentation: unsure     Assessment:    Pregnancy @ [redacted]w[redacted]d weeks   Plan:     labs reviewed, problem list updated Consent signed. GBS sent TDAP offered  Rhogam given for RH negative Pediatrician: discussed. Infant feeding: plans to breastfeed. Maternity leave: discussed. Cigarette smoking: never smoked. Orders Placed This Encounter  Procedures  . Strep Gp B NAA  . POCT urinalysis dipstick   No orders of the defined types were placed in this encounter.   Follow up in 1 Week.

## 2015-12-15 LAB — STREP GP B NAA: Strep Gp B NAA: NEGATIVE

## 2015-12-18 ENCOUNTER — Ambulatory Visit (INDEPENDENT_AMBULATORY_CARE_PROVIDER_SITE_OTHER): Payer: BLUE CROSS/BLUE SHIELD | Admitting: Obstetrics

## 2015-12-18 ENCOUNTER — Encounter: Payer: Self-pay | Admitting: Obstetrics

## 2015-12-18 VITALS — BP 104/70 | HR 99 | Temp 98.8°F | Wt 251.0 lb

## 2015-12-18 DIAGNOSIS — O4403 Placenta previa specified as without hemorrhage, third trimester: Secondary | ICD-10-CM

## 2015-12-18 DIAGNOSIS — Z3483 Encounter for supervision of other normal pregnancy, third trimester: Secondary | ICD-10-CM

## 2015-12-18 DIAGNOSIS — O09521 Supervision of elderly multigravida, first trimester: Secondary | ICD-10-CM

## 2015-12-18 LAB — POCT URINALYSIS DIPSTICK
Bilirubin, UA: NEGATIVE
Blood, UA: NEGATIVE
Glucose, UA: NEGATIVE
KETONES UA: NEGATIVE
LEUKOCYTES UA: NEGATIVE
NITRITE UA: NEGATIVE
Spec Grav, UA: 1.01
Urobilinogen, UA: NEGATIVE
pH, UA: 7

## 2015-12-18 NOTE — Progress Notes (Signed)
Subjective:    Barbara Morales is a 40 y.o. female being seen today for her obstetrical visit. She is at [redacted]w[redacted]d gestation. Patient reports no complaints. Fetal movement: normal.  Problem List Items Addressed This Visit    None    Visit Diagnoses    Encounter for supervision of other normal pregnancy in third trimester    -  Primary    Relevant Orders    POCT urinalysis dipstick (Completed)      Patient Active Problem List   Diagnosis Date Noted  . Advanced maternal age in multigravida 07/09/2015   Objective:    BP 104/70 mmHg  Pulse 99  Temp(Src) 98.8 F (37.1 C)  Wt 251 lb (113.853 kg)  LMP 04/10/2015 FHT:  150 BPM  Uterine Size: size equals dates  Presentation: unsure     Assessment:    Pregnancy @ [redacted]w[redacted]d weeks    Placenta Previa.    Plan:     labs reviewed, problem list updated Consent signed. GBS sent TDAP offered  Rhogam given for RH negative Pediatrician: discussed. Infant feeding: plans to breastfeed. Maternity leave: discussed. Cigarette smoking: never smoked. Orders Placed This Encounter  Procedures  . POCT urinalysis dipstick   No orders of the defined types were placed in this encounter.   Follow up in 1 Week.    C/S scheduled 12-25-15 for Placenta Previa.

## 2015-12-19 ENCOUNTER — Other Ambulatory Visit: Payer: Self-pay | Admitting: *Deleted

## 2015-12-19 ENCOUNTER — Encounter (HOSPITAL_COMMUNITY): Payer: Self-pay

## 2015-12-24 ENCOUNTER — Encounter (HOSPITAL_COMMUNITY)
Admission: RE | Admit: 2015-12-24 | Discharge: 2015-12-24 | Disposition: A | Discharge status: Home / Self Care | Payer: BLUE CROSS/BLUE SHIELD | Source: Ambulatory Visit | Attending: Obstetrics | Admitting: Obstetrics

## 2015-12-24 LAB — CBC
HEMATOCRIT: 34.2 % — AB (ref 36.0–46.0)
Hemoglobin: 10.9 g/dL — ABNORMAL LOW (ref 12.0–15.0)
MCH: 26 pg (ref 26.0–34.0)
MCHC: 31.9 g/dL (ref 30.0–36.0)
MCV: 81.4 fL (ref 78.0–100.0)
PLATELETS: 321 10*3/uL (ref 150–400)
RBC: 4.2 MIL/uL (ref 3.87–5.11)
RDW: 15.4 % (ref 11.5–15.5)
WBC: 9.4 10*3/uL (ref 4.0–10.5)

## 2015-12-24 LAB — ABO/RH: ABO/RH(D): O POS

## 2015-12-24 MED ORDER — GENTAMICIN SULFATE 40 MG/ML IJ SOLN
INTRAVENOUS | Status: AC
  Administered 2015-12-25: 100 mL via INTRAVENOUS
  Filled 2015-12-24: qty 10

## 2015-12-24 NOTE — Patient Instructions (Signed)
[place]  12/24/2015   Your procedure is scheduled on:  12/25/2015  Enter through the Main Entrance of [facility] up the phone at the desk and dial [phone].   Call this number if you have problems the morning of surgery: [phone]   Remember:   Do not eat food:After Midnight.  Do not drink clear liquids: After Midnight.  Take these medicines the morning of surgery with A SIP OF WATER: none   Do not wear jewelry, make-up or nail polish.  Do not wear lotions, powders, or perfumes. You may wear deodorant.  Do not shave 48 hours prior to surgery.  Do not bring valuables to the hospital.  [facility] is not   responsible for any belongings or valuables brought to the hospital.  Contacts, dentures or bridgework may not be worn into surgery.  Leave suitcase in the car. After surgery it may be brought to your room.  For patients admitted to the hospital, checkout time is 11:00 AM the day of              discharge.   Patients discharged the day of surgery will not be allowed to drive             home.  Name and phone number of your driver: na  Special Instructions:   N/A   Please read over the following fact sheets that you were given:   Anesthesia Post-op Instructions

## 2015-12-25 ENCOUNTER — Encounter (HOSPITAL_COMMUNITY)
Admission: AD | Disposition: A | Discharge status: Home / Self Care | Payer: Self-pay | Source: Ambulatory Visit | Attending: Obstetrics & Gynecology

## 2015-12-25 ENCOUNTER — Inpatient Hospital Stay (HOSPITAL_COMMUNITY)
Admission: AD | Admit: 2015-12-25 | Discharge: 2015-12-28 | DRG: 765 | Disposition: A | Discharge status: Home / Self Care | Payer: BLUE CROSS/BLUE SHIELD | Source: Ambulatory Visit | Attending: Obstetrics & Gynecology | Admitting: Obstetrics & Gynecology

## 2015-12-25 ENCOUNTER — Inpatient Hospital Stay (HOSPITAL_COMMUNITY): Payer: BLUE CROSS/BLUE SHIELD | Admitting: Anesthesiology

## 2015-12-25 ENCOUNTER — Encounter (HOSPITAL_COMMUNITY): Payer: Self-pay | Admitting: *Deleted

## 2015-12-25 DIAGNOSIS — D62 Acute posthemorrhagic anemia: Secondary | ICD-10-CM | POA: Diagnosis not present

## 2015-12-25 DIAGNOSIS — O9081 Anemia of the puerperium: Secondary | ICD-10-CM | POA: Diagnosis not present

## 2015-12-25 DIAGNOSIS — O4403 Placenta previa specified as without hemorrhage, third trimester: Principal | ICD-10-CM | POA: Diagnosis present

## 2015-12-25 DIAGNOSIS — Z302 Encounter for sterilization: Secondary | ICD-10-CM | POA: Diagnosis not present

## 2015-12-25 DIAGNOSIS — Z6841 Body Mass Index (BMI) 40.0 and over, adult: Secondary | ICD-10-CM | POA: Diagnosis not present

## 2015-12-25 DIAGNOSIS — O99214 Obesity complicating childbirth: Secondary | ICD-10-CM | POA: Diagnosis present

## 2015-12-25 DIAGNOSIS — Z98891 History of uterine scar from previous surgery: Secondary | ICD-10-CM

## 2015-12-25 DIAGNOSIS — Z3A37 37 weeks gestation of pregnancy: Secondary | ICD-10-CM

## 2015-12-25 HISTORY — PX: TUBAL LIGATION: SHX77

## 2015-12-25 LAB — PREPARE RBC (CROSSMATCH)

## 2015-12-25 LAB — RPR: RPR Ser Ql: NONREACTIVE

## 2015-12-25 SURGERY — Surgical Case
Anesthesia: Spinal | Wound class: Clean Contaminated

## 2015-12-25 MED ORDER — DIBUCAINE 1 % RE OINT: 1.0000 "application " | TOPICAL_OINTMENT | RECTAL | Status: DC | PRN

## 2015-12-25 MED ORDER — OXYTOCIN 40 UNITS IN LACTATED RINGERS INFUSION - SIMPLE MED
INTRAVENOUS | Status: DC | PRN
  Administered 2015-12-25: 40 [IU] via INTRAVENOUS

## 2015-12-25 MED ORDER — OXYCODONE HCL 5 MG PO TABS: 10.0000 mg | ORAL_TABLET | ORAL | Status: DC | PRN

## 2015-12-25 MED ORDER — MORPHINE SULFATE-NACL 0.5-0.9 MG/ML-% IV SOSY
PREFILLED_SYRINGE | INTRAVENOUS | Status: DC | PRN
  Administered 2015-12-25: .2 mg via INTRATHECAL

## 2015-12-25 MED ORDER — KETOROLAC TROMETHAMINE 30 MG/ML IJ SOLN: 30.0000 mg | Freq: Four times a day (QID) | INTRAMUSCULAR | Status: DC | PRN

## 2015-12-25 MED ORDER — MEPERIDINE HCL 25 MG/ML IJ SOLN: 6.2500 mg | INTRAMUSCULAR | Status: DC | PRN

## 2015-12-25 MED ORDER — HYDROMORPHONE HCL 1 MG/ML IJ SOLN: 0.2500 mg | INTRAMUSCULAR | Status: DC | PRN

## 2015-12-25 MED ORDER — KETOROLAC TROMETHAMINE 30 MG/ML IJ SOLN
30.0000 mg | Freq: Four times a day (QID) | INTRAMUSCULAR | Status: DC | PRN
  Administered 2015-12-25: 30 mg via INTRAMUSCULAR

## 2015-12-25 MED ORDER — BUPIVACAINE IN DEXTROSE 0.75-8.25 % IT SOLN
INTRATHECAL | Status: DC | PRN
  Administered 2015-12-25: 1.6 mL via INTRATHECAL

## 2015-12-25 MED ORDER — SODIUM CHLORIDE 0.9% FLUSH: 3.0000 mL | INTRAVENOUS | Status: DC | PRN

## 2015-12-25 MED ORDER — NALBUPHINE HCL 10 MG/ML IJ SOLN: 5.0000 mg | INTRAMUSCULAR | Status: DC | PRN

## 2015-12-25 MED ORDER — SCOPOLAMINE 1 MG/3DAYS TD PT72: 1.0000 | MEDICATED_PATCH | Freq: Once | TRANSDERMAL | Status: DC

## 2015-12-25 MED ORDER — LACTATED RINGERS IV SOLN
INTRAVENOUS | Status: DC
  Administered 2015-12-25 – 2015-12-26 (×2): via INTRAVENOUS

## 2015-12-25 MED ORDER — ONDANSETRON HCL 4 MG/2ML IJ SOLN: 4.0000 mg | Freq: Three times a day (TID) | INTRAMUSCULAR | Status: DC | PRN

## 2015-12-25 MED ORDER — PROMETHAZINE HCL 25 MG/ML IJ SOLN: 6.2500 mg | INTRAMUSCULAR | Status: DC | PRN

## 2015-12-25 MED ORDER — SIMETHICONE 80 MG PO CHEW
80.0000 mg | CHEWABLE_TABLET | ORAL | Status: DC
  Administered 2015-12-25 – 2015-12-28 (×3): 80 mg via ORAL
  Filled 2015-12-25 (×3): qty 1

## 2015-12-25 MED ORDER — PHENYLEPHRINE 8 MG IN D5W 100 ML (0.08MG/ML) PREMIX OPTIME
INJECTION | INTRAVENOUS | Status: AC
  Filled 2015-12-25: qty 100

## 2015-12-25 MED ORDER — OXYTOCIN 10 UNIT/ML IJ SOLN
INTRAMUSCULAR | Status: AC
  Filled 2015-12-25: qty 4

## 2015-12-25 MED ORDER — OXYCODONE HCL 5 MG PO TABS: 5.0000 mg | ORAL_TABLET | ORAL | Status: DC | PRN

## 2015-12-25 MED ORDER — MENTHOL 3 MG MT LOZG: 1.0000 | LOZENGE | OROMUCOSAL | Status: DC | PRN

## 2015-12-25 MED ORDER — KETOROLAC TROMETHAMINE 30 MG/ML IJ SOLN: 30.0000 mg | Freq: Once | INTRAMUSCULAR | Status: DC

## 2015-12-25 MED ORDER — IBUPROFEN 600 MG PO TABS
600.0000 mg | ORAL_TABLET | Freq: Four times a day (QID) | ORAL | Status: DC
  Administered 2015-12-25 – 2015-12-28 (×10): 600 mg via ORAL
  Filled 2015-12-25 (×11): qty 1

## 2015-12-25 MED ORDER — DIPHENHYDRAMINE HCL 50 MG/ML IJ SOLN: 12.5000 mg | INTRAMUSCULAR | Status: DC | PRN

## 2015-12-25 MED ORDER — LACTATED RINGERS IV SOLN
INTRAVENOUS | Status: DC | PRN
  Administered 2015-12-25 (×3): via INTRAVENOUS

## 2015-12-25 MED ORDER — MORPHINE SULFATE (PF) 0.5 MG/ML IJ SOLN
INTRAMUSCULAR | Status: AC
  Filled 2015-12-25: qty 10

## 2015-12-25 MED ORDER — PHENYLEPHRINE 8 MG IN D5W 100 ML (0.08MG/ML) PREMIX OPTIME
INJECTION | INTRAVENOUS | Status: DC | PRN
  Administered 2015-12-25: 60 ug/min via INTRAVENOUS

## 2015-12-25 MED ORDER — DIPHENHYDRAMINE HCL 25 MG PO CAPS
25.0000 mg | ORAL_CAPSULE | ORAL | Status: DC | PRN
  Filled 2015-12-25: qty 1

## 2015-12-25 MED ORDER — SCOPOLAMINE 1 MG/3DAYS TD PT72
MEDICATED_PATCH | TRANSDERMAL | Status: AC
  Administered 2015-12-25: 1.5 mg via TRANSDERMAL
  Filled 2015-12-25: qty 1

## 2015-12-25 MED ORDER — ACETAMINOPHEN 500 MG PO TABS
1000.0000 mg | ORAL_TABLET | Freq: Four times a day (QID) | ORAL | Status: AC
  Administered 2015-12-25 – 2015-12-26 (×3): 1000 mg via ORAL
  Filled 2015-12-25 (×3): qty 2

## 2015-12-25 MED ORDER — SIMETHICONE 80 MG PO CHEW
80.0000 mg | CHEWABLE_TABLET | Freq: Three times a day (TID) | ORAL | Status: DC
  Administered 2015-12-25 – 2015-12-28 (×8): 80 mg via ORAL
  Filled 2015-12-25 (×8): qty 1

## 2015-12-25 MED ORDER — ONDANSETRON HCL 4 MG/2ML IJ SOLN
INTRAMUSCULAR | Status: AC
  Filled 2015-12-25: qty 2

## 2015-12-25 MED ORDER — DIPHENHYDRAMINE HCL 25 MG PO CAPS: 25.0000 mg | ORAL_CAPSULE | Freq: Four times a day (QID) | ORAL | Status: DC | PRN

## 2015-12-25 MED ORDER — NALOXONE HCL 2 MG/2ML IJ SOSY
1.0000 ug/kg/h | PREFILLED_SYRINGE | INTRAVENOUS | Status: DC | PRN
  Filled 2015-12-25: qty 2

## 2015-12-25 MED ORDER — SCOPOLAMINE 1 MG/3DAYS TD PT72
1.0000 | MEDICATED_PATCH | Freq: Once | TRANSDERMAL | Status: AC
  Administered 2015-12-25: 1.5 mg via TRANSDERMAL

## 2015-12-25 MED ORDER — FENTANYL CITRATE (PF) 100 MCG/2ML IJ SOLN
INTRAMUSCULAR | Status: DC | PRN
  Administered 2015-12-25: 20 ug via INTRATHECAL

## 2015-12-25 MED ORDER — LACTATED RINGERS IV SOLN
INTRAVENOUS | Status: DC
  Administered 2015-12-25: 11:00:00 via INTRAVENOUS

## 2015-12-25 MED ORDER — SENNOSIDES-DOCUSATE SODIUM 8.6-50 MG PO TABS
2.0000 | ORAL_TABLET | ORAL | Status: DC
  Administered 2015-12-25 – 2015-12-28 (×3): 2 via ORAL
  Filled 2015-12-25 (×3): qty 2

## 2015-12-25 MED ORDER — PHENYLEPHRINE 40 MCG/ML (10ML) SYRINGE FOR IV PUSH (FOR BLOOD PRESSURE SUPPORT)
PREFILLED_SYRINGE | INTRAVENOUS | Status: AC
  Filled 2015-12-25: qty 10

## 2015-12-25 MED ORDER — NALOXONE HCL 0.4 MG/ML IJ SOLN: 0.4000 mg | INTRAMUSCULAR | Status: DC | PRN

## 2015-12-25 MED ORDER — DEXAMETHASONE SODIUM PHOSPHATE 4 MG/ML IJ SOLN
INTRAMUSCULAR | Status: DC | PRN
  Administered 2015-12-25: 4 mg via INTRAVENOUS

## 2015-12-25 MED ORDER — DEXAMETHASONE SODIUM PHOSPHATE 4 MG/ML IJ SOLN
INTRAMUSCULAR | Status: AC
  Filled 2015-12-25: qty 1

## 2015-12-25 MED ORDER — FENTANYL CITRATE (PF) 100 MCG/2ML IJ SOLN
INTRAMUSCULAR | Status: AC
Start: 2015-12-25 — End: 2015-12-25
  Filled 2015-12-25: qty 2

## 2015-12-25 MED ORDER — ONDANSETRON HCL 4 MG/2ML IJ SOLN
INTRAMUSCULAR | Status: DC | PRN
  Administered 2015-12-25: 4 mg via INTRAVENOUS

## 2015-12-25 MED ORDER — KETOROLAC TROMETHAMINE 30 MG/ML IJ SOLN
INTRAMUSCULAR | Status: AC
Start: 2015-12-25 — End: 2015-12-26
  Filled 2015-12-25: qty 1

## 2015-12-25 MED ORDER — IBUPROFEN 600 MG PO TABS
600.0000 mg | ORAL_TABLET | Freq: Four times a day (QID) | ORAL | Status: DC | PRN
  Administered 2015-12-27: 600 mg via ORAL

## 2015-12-25 MED ORDER — SIMETHICONE 80 MG PO CHEW: 80.0000 mg | CHEWABLE_TABLET | ORAL | Status: DC | PRN

## 2015-12-25 MED ORDER — NALBUPHINE HCL 10 MG/ML IJ SOLN: 5.0000 mg | Freq: Once | INTRAMUSCULAR | Status: DC | PRN

## 2015-12-25 MED ORDER — TETANUS-DIPHTH-ACELL PERTUSSIS 5-2.5-18.5 LF-MCG/0.5 IM SUSP
0.5000 mL | Freq: Once | INTRAMUSCULAR | Status: AC
  Administered 2015-12-27: 0.5 mL via INTRAMUSCULAR
  Filled 2015-12-25: qty 0.5

## 2015-12-25 MED ORDER — PRENATAL MULTIVITAMIN CH
1.0000 | ORAL_TABLET | Freq: Every day | ORAL | Status: DC
  Administered 2015-12-26 – 2015-12-28 (×3): 1 via ORAL
  Filled 2015-12-25 (×3): qty 1

## 2015-12-25 MED ORDER — OXYTOCIN 40 UNITS IN LACTATED RINGERS INFUSION - SIMPLE MED: 2.5000 [IU]/h | INTRAVENOUS | Status: AC

## 2015-12-25 MED ORDER — COCONUT OIL OIL
1.0000 "application " | TOPICAL_OIL | Status: DC | PRN
  Administered 2015-12-28: 1 via TOPICAL
  Filled 2015-12-25: qty 120

## 2015-12-25 MED ORDER — PHENYLEPHRINE HCL 10 MG/ML IJ SOLN
INTRAMUSCULAR | Status: DC | PRN
  Administered 2015-12-25: 80 ug via INTRAVENOUS
  Administered 2015-12-25: 120 ug via INTRAVENOUS

## 2015-12-25 MED ORDER — ACETAMINOPHEN 325 MG PO TABS
650.0000 mg | ORAL_TABLET | ORAL | Status: DC | PRN
  Administered 2015-12-28: 650 mg via ORAL
  Filled 2015-12-25: qty 2

## 2015-12-25 MED ORDER — ZOLPIDEM TARTRATE 5 MG PO TABS: 5.0000 mg | ORAL_TABLET | Freq: Every evening | ORAL | Status: DC | PRN

## 2015-12-25 MED ORDER — WITCH HAZEL-GLYCERIN EX PADS: 1.0000 "application " | MEDICATED_PAD | CUTANEOUS | Status: DC | PRN

## 2015-12-25 SURGICAL SUPPLY — 36 items
CLAMP CORD UMBIL (MISCELLANEOUS) IMPLANT
CLOSURE STERI STRIP 1/2 X4 (GAUZE/BANDAGES/DRESSINGS) ×4 IMPLANT
CLOTH BEACON ORANGE TIMEOUT ST (SAFETY) ×4 IMPLANT
CONTAINER PREFILL 10% NBF 15ML (MISCELLANEOUS) ×8 IMPLANT
DRSG OPSITE POSTOP 4X10 (GAUZE/BANDAGES/DRESSINGS) ×4 IMPLANT
DURAPREP 26ML APPLICATOR (WOUND CARE) ×4 IMPLANT
ELECT REM PT RETURN 9FT ADLT (ELECTROSURGICAL) ×4
ELECTRODE REM PT RTRN 9FT ADLT (ELECTROSURGICAL) ×2 IMPLANT
EXTRACTOR VACUUM M CUP 4 TUBE (SUCTIONS) IMPLANT
EXTRACTOR VACUUM M CUP 4' TUBE (SUCTIONS)
GLOVE BIO SURGEON STRL SZ8 (GLOVE) ×4 IMPLANT
GLOVE BIOGEL PI IND STRL 7.0 (GLOVE) ×2 IMPLANT
GLOVE BIOGEL PI INDICATOR 7.0 (GLOVE) ×2
GOWN STRL REUS W/TWL LRG LVL3 (GOWN DISPOSABLE) ×8 IMPLANT
KIT ABG SYR 3ML LUER SLIP (SYRINGE) IMPLANT
LIQUID BAND (GAUZE/BANDAGES/DRESSINGS) ×4 IMPLANT
NEEDLE HYPO 22GX1.5 SAFETY (NEEDLE) ×4 IMPLANT
NEEDLE HYPO 25X5/8 SAFETYGLIDE (NEEDLE) ×4 IMPLANT
NS IRRIG 1000ML POUR BTL (IV SOLUTION) ×4 IMPLANT
PACK C SECTION WH (CUSTOM PROCEDURE TRAY) ×4 IMPLANT
PAD OB MATERNITY 4.3X12.25 (PERSONAL CARE ITEMS) ×4 IMPLANT
PENCIL SMOKE EVAC W/HOLSTER (ELECTROSURGICAL) ×4 IMPLANT
RTRCTR C-SECT PINK 25CM LRG (MISCELLANEOUS) ×4 IMPLANT
SUT GUT PLAIN 0 CT-3 TAN 27 (SUTURE) ×4 IMPLANT
SUT MNCRL 0 VIOLET CTX 36 (SUTURE) ×6 IMPLANT
SUT MNCRL AB 4-0 PS2 18 (SUTURE) IMPLANT
SUT MON AB 2-0 CT1 27 (SUTURE) ×4 IMPLANT
SUT MON AB 3-0 SH 27 (SUTURE)
SUT MON AB 3-0 SH27 (SUTURE) IMPLANT
SUT MONOCRYL 0 CTX 36 (SUTURE) ×6
SUT PLAIN 2 0 XLH (SUTURE) IMPLANT
SUT VIC AB 0 CTX 36 (SUTURE) ×4
SUT VIC AB 0 CTX36XBRD ANBCTRL (SUTURE) ×4 IMPLANT
SYR CONTROL 10ML LL (SYRINGE) ×4 IMPLANT
TOWEL OR 17X24 6PK STRL BLUE (TOWEL DISPOSABLE) ×4 IMPLANT
TRAY FOLEY CATH SILVER 14FR (SET/KITS/TRAYS/PACK) ×4 IMPLANT

## 2015-12-25 NOTE — Anesthesia Postprocedure Evaluation (Signed)
Anesthesia Post Note  Patient: Barbara Morales  Procedure(s) Performed: Procedure(s) (LRB): CESAREAN SECTION (N/A) BILATERAL TUBAL LIGATION (Bilateral)  Patient location during evaluation: PACU Anesthesia Type: Spinal Level of consciousness: awake Pain management: pain level controlled Vital Signs Assessment: post-procedure vital signs reviewed and stable Respiratory status: spontaneous breathing Cardiovascular status: stable Postop Assessment: no headache, no backache, spinal receding, patient able to bend at knees and no signs of nausea or vomiting Anesthetic complications: no     Last Vitals:  Filed Vitals:   12/25/15 1345 12/25/15 1400  BP: 99/64 102/65  Pulse: 72 76  Temp:    Resp: 18 16    Last Pain:  Filed Vitals:   12/25/15 1408  PainSc: 1    Pain Goal: Patients Stated Pain Goal: 0 (12/25/15 1345)               [place]

## 2015-12-25 NOTE — Transfer of Care (Signed)
Immediate Anesthesia Transfer of Care Note  Patient: Barbara Morales  Procedure(s) Performed: Procedure(s): CESAREAN SECTION (N/A) BILATERAL TUBAL LIGATION (Bilateral)  Patient Location: PACU  Anesthesia Type:Spinal  Level of Consciousness: awake, alert , oriented and patient cooperative  Airway & Oxygen Therapy: Patient Spontanous Breathing  Post-op Assessment: Report given to RN and Post -op Vital signs reviewed and stable  Post vital signs: Reviewed and stable  Last Vitals:  Filed Vitals:   12/25/15 1322 12/25/15 1323  BP:    Pulse: 77 75  Temp:    Resp: 20 21    Last Pain: There were no vitals filed for this visit.    Patients Stated Pain Goal: 2 ([id number])  Complications: No apparent anesthesia complications

## 2015-12-25 NOTE — Op Note (Addendum)
Cesarean Section Operative Report  Barbara Morales  12/25/2015  Indications: placenta previa   Pre-operative Diagnosis: Primary c-section PLACENTA PREVIA  Bilateral tubal ligation, desires sterilization.   Post-operative Diagnosis: Same   Surgeon: Surgeon(s) and Role:    * Gwynne Edinger, MD - Assisting    * Shelly Bombard, MD - Primary   Attending Attestation: I was present and scrubbed for the entire procedure.   Assistants: none  Anesthesia: spinal    Estimated Blood Loss: 900 ml  Total IV Fluids: 3400 ml LR  Urine Output:: 200 ml clear yellow urine  Specimens: none  Findings: Viable female infant in cephalic presentation; Apgars 8/9; weight 2920 g; arterial cord pH not obtained; clear amniotic fluid; intact placenta with three vessel cord; normal uterus, fallopian tubes and ovaries bilaterally.  Baby condition / location:  Couplet care / Skin to Skin   Complications: no complications  Indications: Barbara Morales is a 40 y.o. [id number] with an IUP [redacted]w[redacted]d presenting for primary c/s for placenta previa.  The risks, benefits, complications, treatment options, and exected outcomes were discussed with the patient . The patient dwith the proposed plan, giving informed consent. identified as Barbara Morales and the procedure verified as C-Section Delivery.  Procedure Details:  The patient was taken back to the operative suite where spinal anesthesia was placed.  A time out was held and the above information confirmed.   After induction of anesthesia, the patient was draped and prepped in the usual sterile manner and placed in a dorsal supine position with a leftward tilt. A Pfannenstiel incision was made and carried down through the subcutaneous tissue to the fascia. Fascial incision was made and sharply extended transversely. The fascia was separated from the underlying rectus tissue superiorly and inferiorly. The peritoneum was identified and bluntly entered and extended  longitudinally. Alexis retractor was placed. Bladder flap created. A low transverse uterine incision was made and extended bluntly. Delivered from cephalic presentation with vacuum assistance was a viable infant with Apgars and weight as above.  The umbilical cord was clamped and cut cord blood was obtained for evaluation. Cord ph was not sent. The placenta was removed Intact and appeared normal. The uterine outline, tubes and ovaries appeared normal. The uterine incision was closed with running locked sutures of 0 monocryl in a single layer. Hemostasis was observed after placing one figure of 8 o monocryl suture. The patient's right fallopian tube was then identified, brought to the incision, and grasped with a Babcock clamp. The tube was then followed out to the fimbria. The Babcock clamp was then used to grasp the tube approximately 4 cm from the cornual region. A 2 cm segment of the tube was then ligated with free tie of plain gut suture, transected and excised. Good hemostasis was noted and the tube was returned to the abdomen. The left fallopian tube was then identified to its fimbriated end, ligated, and a 2 cm segment excised in a similar fashion. Excellent hemostasis was noted, and the tube returned to the abdomen.  The peritoneum was closed with 0 monocryl. The rectus muscles were examined and hemostasis observed. The fascia was then reapproximated with running sutures of 0Vicryl. The skin was closed with 4-0Vicryl.   Instrument, sponge, and needle counts were correct prior the abdominal closure and were correct at the conclusion of the case.     Disposition: PACU - hemodynamically stable.   Maternal Condition: stable       Signed: Patsy Lager WoukMD 12/25/2015 1:06  PM

## 2015-12-25 NOTE — H&P (Signed)
Barbara Morales is a 40 y.o. female presenting for C/S for placenta previa at [redacted] weeks gestation. Maternal Medical History:  Reason for admission: Placenta previa.  Fetal activity: Perceived fetal activity is normal.   Last perceived fetal movement was within the past hour.    Prenatal complications: Placental abnormality.   Prenatal Complications - Diabetes: none.    OB History    Gravida Para Term Preterm AB TAB SAB Ectopic Multiple Living   4 3 3       3      Past Medical History  Diagnosis Date  . Medical history non-contributory   . Vaginal delivery 1998, 2001, 2002   Past Surgical History  Procedure Laterality Date  . No past surgeries     Family History: family history is not on file. Social History:  reports that she has never smoked. She has never used smokeless tobacco. She reports that she does not drink alcohol or use illicit drugs.   Prenatal Transfer Tool  Maternal Diabetes: No Genetic Screening: AMA Maternal Ultrasounds/Referrals: Referred to MFM for AMA Fetal Ultrasounds or other Referrals:  Referred to Materal Fetal Medicine  Maternal Substance Abuse:  No Significant Maternal Medications:  None Significant Maternal Lab Results:  Lab values include: Group B Strep negative Other Comments:  None  Review of Systems  All other systems reviewed and are negative.     Blood pressure 115/68, pulse 82, temperature 98.4 F (36.9 C), temperature source Oral, resp. rate 18, last menstrual period 04/10/2015, SpO2 97 %. Maternal Exam:  Abdomen: Patient reports no abdominal tenderness.   Physical Exam  Nursing note and vitals reviewed. Constitutional: She is oriented to person, place, and time. She appears well-developed and well-nourished.  HENT:  Head: Normocephalic and atraumatic.  Eyes: Conjunctivae are normal. Pupils are equal, round, and reactive to light.  Neck: Normal range of motion. Neck supple.  Cardiovascular: Normal rate and regular rhythm.    Respiratory: Effort normal and breath sounds normal.  GI: Soft. Bowel sounds are normal.  Musculoskeletal: Normal range of motion.  Neurological: She is alert and oriented to person, place, and time.  Skin: Skin is warm and dry.  Psychiatric: She has a normal mood and affect. Her behavior is normal. Judgment and thought content normal.    Prenatal labs: ABO, Rh: --/--/O POS (07/18 1254) Antibody: NEG (07/18 1249) Rubella: Immune (01/03 0000) RPR: Non Reactive (07/18 1248)  HBsAg: Negative (01/03 0000)  HIV: Non Reactive (05/22 1020)  GBS: Negative (07/07 1202)   Assessment/Plan: 37 weeks.  Placenta previa.  For primary C/S.   Francille Wittmann A 12/25/2015, 10:27 AM

## 2015-12-25 NOTE — Anesthesia Preprocedure Evaluation (Signed)
Anesthesia Evaluation  Patient identified by MRN, date of birth, ID band Patient awake    Reviewed: Allergy & Precautions, H&P , Patient's Chart, lab work & pertinent test results  Airway Mallampati: III  TM Distance: >3 FB Neck ROM: full    Dental no notable dental hx.    Pulmonary neg pulmonary ROS,    Pulmonary exam normal        Cardiovascular negative cardio ROS Normal cardiovascular exam     Neuro/Psych negative neurological ROS  negative psych ROS   GI/Hepatic negative GI ROS, Neg liver ROS,   Endo/Other  Morbid obesity  Renal/GU negative Renal ROS     Musculoskeletal   Abdominal (+) + obese,   Peds  Hematology negative hematology ROS (+)   Anesthesia Other Findings   Reproductive/Obstetrics (+) Pregnancy                             Anesthesia Physical Anesthesia Plan  ASA: III  Anesthesia Plan: Spinal   Post-op Pain Management:    Induction:   Airway Management Planned:   Additional Equipment:   Intra-op Plan:   Post-operative Plan:   Informed Consent: I have reviewed the patients History and Physical, chart, labs and discussed the procedure including the risks, benefits and alternatives for the proposed anesthesia with the patient or authorized representative who has indicated his/her understanding and acceptance.     Plan Discussed with: CRNA and Surgeon  Anesthesia Plan Comments:         Anesthesia Quick Evaluation

## 2015-12-25 NOTE — Lactation Note (Signed)
This note was copied from a baby's chart. Lactation Consultation Note  Patient Name: Barbara Morales [id number] Date: 12/25/2015 Reason for consult: Initial assessment Baby at 8 hr of life. Mom reports baby will latch but has been sleepy. She exclusively pumped for her oldest son for 6 months because he would not latch. She bf her daughters 63m and 71m because she had cracked and bleeding nipples for the entire time she was bf. She went to lactation visits and was told that the latched looked perfect. If she starts to have sore nipple issues this baby may need an oral assessment. Discussed baby behavior, feeding frequency, baby belly size, voids, wt loss, breast changes, and nipple care. Mom stated that she can manually express and has spoon in room. Given lactation handouts. Aware of OP services and support group. She will call as needed.       Maternal Data    Feeding Feeding Type: Breast Fed  LATCH Score/Interventions Latch: Repeated attempts needed to sustain latch, nipple held in mouth throughout feeding, stimulation needed to elicit sucking reflex. Intervention(s): Adjust position;Assist with latch;Breast massage;Breast compression  Audible Swallowing: A few with stimulation Intervention(s): Skin to skin;Hand expression;Alternate breast massage  Type of Nipple: Everted at rest and after stimulation  Comfort (Breast/Nipple): Soft / non-tender     Hold (Positioning): Assistance needed to correctly position infant at breast and maintain latch. Intervention(s): Breastfeeding basics reviewed;Support Pillows;Position options  LATCH Score: 7  Lactation Tools Discussed/Used WIC Program: Yes   Consult Status Consult Status: Follow-up Date: 12/26/15 Follow-up type: In-patient    Denzil Hughes 12/25/2015, 8:39 PM

## 2015-12-26 LAB — CBC
HEMATOCRIT: 27.3 % — AB (ref 36.0–46.0)
Hemoglobin: 8.9 g/dL — ABNORMAL LOW (ref 12.0–15.0)
MCH: 26.5 pg (ref 26.0–34.0)
MCHC: 32.6 g/dL (ref 30.0–36.0)
MCV: 81.3 fL (ref 78.0–100.0)
Platelets: 292 10*3/uL (ref 150–400)
RBC: 3.36 MIL/uL — AB (ref 3.87–5.11)
RDW: 15.6 % — AB (ref 11.5–15.5)
WBC: 15.6 10*3/uL — AB (ref 4.0–10.5)

## 2015-12-26 MED ORDER — POLYSACCHARIDE IRON COMPLEX 150 MG PO CAPS
150.0000 mg | ORAL_CAPSULE | Freq: Every day | ORAL | Status: DC
  Administered 2015-12-26 – 2015-12-28 (×3): 150 mg via ORAL
  Filled 2015-12-26 (×3): qty 1

## 2015-12-26 NOTE — Addendum Note (Signed)
Addendum  created 12/26/15 0746 by Georgeanne Nim, CRNA   Modules edited: Clinical Notes   Clinical Notes:  File: [id number]

## 2015-12-26 NOTE — Progress Notes (Addendum)
Subjective: Postpartum Day #1: Cesarean Delivery Patient reports incisional pain, tolerating PO and no problems voiding.  Is having trouble with latch.    Objective: Vital signs in last 24 hours: Temp:  [97.7 F (36.5 C)-99.3 F (37.4 C)] 98.6 F (37 C) (07/20 0200) Pulse Rate:  [59-82] 62 (07/20 0630) Resp:  [11-21] 18 (07/20 0630) BP: (99-115)/(50-80) 100/50 mmHg (07/20 0630) SpO2:  [95 %-100 %] 97 % (07/20 0630) Weight:  [252 lb (114.306 kg)] 252 lb (114.306 kg) (07/19 2328)  Physical Exam:  General: alert, cooperative and no distress Lochia: appropriate Uterine Fundus: firm Incision: no significant drainage, no dehiscence, no significant erythema DVT Evaluation: No evidence of DVT seen on physical exam.   Recent Labs  12/24/15 1248 12/26/15 0533  HGB 10.9* 8.9*  HCT 34.2* 27.3*    Assessment/Plan: Status post Cesarean section. Doing well postoperatively.  Foley out this AM.   Anemia.  Acute Blood Loss on Chronic Iron Deficiency Anemia.  Stable hemodynamically.   Continue current care.  Lactation consult ordered.  Niferex started for anemia.    Barbara Morales, CNM 12/26/2015, 8:20 AM

## 2015-12-26 NOTE — Anesthesia Postprocedure Evaluation (Signed)
Anesthesia Post Note  Patient: Barbara Morales  Procedure(s) Performed: Procedure(s) (LRB): CESAREAN SECTION (N/A) BILATERAL TUBAL LIGATION (Bilateral)  Patient location during evaluation: Mother Baby Anesthesia Type: Spinal Level of consciousness: awake Pain management: pain level controlled Vital Signs Assessment: post-procedure vital signs reviewed and stable Respiratory status: spontaneous breathing Cardiovascular status: stable Postop Assessment: no headache, no backache, spinal receding and patient able to bend at knees Anesthetic complications: no     Last Vitals:  Filed Vitals:   12/26/15 0200 12/26/15 0630  BP: 109/67 100/50  Pulse: 71 62  Temp: 37 C   Resp: 18 18    Last Pain:  Filed Vitals:   12/26/15 0641  PainSc: 0-No pain   Pain Goal: Patients Stated Pain Goal: 0 (12/25/15 2333)               Everette Rank

## 2015-12-27 NOTE — Clinical Documentation Improvement (Signed)
OB/GYN  Please update your documentation within the medical record to reflect your response to this query. Thank you  Can anemia be further specified?   Acute Blood Loss Anemia   Acute on chronic blood loss anemia  Chronic blood loss anemia  Post Op (Acute) Blood Loss Anemia     Other  Clinically Undetermined  Please include the suspected or known cause or associated condition(s):  Supporting Information: 12/26/15 progr note.Marland KitchenMarland Kitchen"Postpartum Day #1: Cesarean Delivery"..."Niferex started for anemia"... 12/25/15 Op Note..."Estimated Blood Loss: 900 ml"   Please exercise your independent, professional judgment when responding. A specific answer is not anticipated or expected.  Thank You,  Ermelinda Das, RN, BSN, [place] Certified Clinical Documentation Specialist [place]: Health Information Management [phone]

## 2015-12-27 NOTE — Progress Notes (Signed)
Subjective: Postpartum Day #2: Cesarean Delivery &BTL Patient reports incisional pain, tolerating PO, + flatus and no problems voiding.    Objective: Vital signs in last 24 hours: Temp:  [98.1 F (36.7 C)-98.4 F (36.9 C)] 98.4 F (36.9 C) (07/20 1746) Pulse Rate:  [53-78] 77 (07/21 0613) Resp:  [18] 18 (07/21 0613) BP: (105-125)/(50-71) 125/71 mmHg (07/21 [id number])  Physical Exam:  General: alert, cooperative and no distress Lochia: appropriate Uterine Fundus: firm Incision: no significant drainage, no significant erythema DVT Evaluation: No evidence of DVT seen on physical exam. No cords or calf tenderness. No significant calf/ankle edema.   Recent Labs  12/24/15 1248 12/26/15 0533  HGB 10.9* 8.9*  HCT 34.2* 27.3*    Assessment/Plan: Status post Cesarean section. Doing well postoperatively.  Continue current care.  Dressing change ordered.    Morene Crocker, CNM 12/27/2015, 8:22 AM

## 2015-12-27 NOTE — Progress Notes (Signed)
Clarified wound care order with Dr Cephas Darby dressing and steri strips per protocol."  LTCS dressing  is changed and steri strips are applied with aseptic technique. Pt tolerated procedure well. Pain prior to and after dressing change pt rated pain 0.  Abdomenal binder is applied.

## 2015-12-27 NOTE — Lactation Note (Signed)
This note was copied from a baby's chart. Lactation Consultation Note  Patient Name: Barbara Morales [id number] Date: 12/27/2015 Reason for consult: Follow-up assessment   With this mom of an early term baby, now 5 lbs 15 oz. Mom has been pumping but not following with hand expression. On exam, mom had lots of easily expressed transitional milk. I had her pump in the maintenance setting, and she was able to express at least 20 ml's or more - she was still pumping when left the room. I advised mom to feed EBM and then formula, and to increase amount offered to baby to 30 ml's, every 3 hours. Mom has been syringe feeding the baby. Since the baby in under 6 pounds and early, I told her to switch to bottle feeding of supplement for now, but to start with putting baby to breast for 15 minutes, if she will latch.  Mom very receptive to teaching, and happy to see her milk transitioning in. Mom knows to call for questions/concerns.    Maternal Data    Feeding Feeding Type: Breast Fed Length of feed: 3 min (on off latch)  LATCH Score/Interventions    Audible Swallowing:  (easily expressed transitional milk)  Type of Nipple: Everted at rest and after stimulation  Comfort (Breast/Nipple): Filling, red/small blisters or bruises, mild/mod discomfort  Problem noted: Filling        Lactation Tools Discussed/Used Tools:  (Prepump. SNS via feeding feed unsuccessful. ) Breast pump type: Double-Electric Breast Pump Pump Review: Setup, frequency, and cleaning;Milk Storage;Other (comment) (hand expression taught , and pump part care, and pump setting used today maintnance)   Consult Status Consult Status: Follow-up Date: 12/28/15 Follow-up type: In-patient    Tonna Corner 12/27/2015, 4:48 PM

## 2015-12-28 LAB — TYPE AND SCREEN
ABO/RH(D): O POS
Antibody Screen: NEGATIVE
Unit division: 0
Unit division: 0

## 2015-12-28 NOTE — Discharge Summary (Signed)
OB Discharge Summary     Patient Name: Barbara Morales DOB: 1976-04-02 MRN: [id number]  Date of admission: 12/25/2015 Delivering MD: Barbara Morales   Date of discharge: 12/28/2015  Admitting diagnosis: PREG PENTAL PREVIA  Intrauterine pregnancy: [redacted]w[redacted]d     Secondary diagnosis:  Active Problems:   Status post primary low transverse cesarean section  Additional problems: AMA     Discharge diagnosis: Term Pregnancy Delivered                                                                                                Post partum procedures:postpartum tubal ligation  Augmentation: none  Complications: None  Hospital course:  Sceduled C/S   40 y.o. yo [id number] at [redacted]w[redacted]d was admitted to the hospital 12/25/2015 for scheduled cesarean section with the following indication:Previa.  Membrane Rupture Time/Date: 12:28 PM ,12/25/2015   Patient delivered Morales Viable infant.12/25/2015  Details of operation can be found in separate operative note.  Pateint had an uncomplicated postpartum course.  She is ambulating, tolerating Morales regular diet, passing flatus, and urinating well. Patient is discharged home in stable condition on  12/28/2015          Physical exam  Filed Vitals:   12/26/15 1746 12/27/15 0613 12/27/15 2050 12/28/15 0539  BP: 110/60 125/71 110/70 118/64  Pulse: 78 77 60 72  Temp: 98.4 F (36.9 C)  99.6 F (37.6 C) 98.5 F (36.9 C)  TempSrc: Oral  Oral Oral  Resp: 18 18 18 18   Height:      Weight:      SpO2:       General: alert, cooperative and no distress Lochia: appropriate Uterine Fundus: firm Incision: Healing well with no significant drainage DVT Evaluation: No evidence of DVT seen on physical exam. No significant calf/ankle edema. Labs: Lab Results  Component Value Date   WBC 15.6* 12/26/2015   HGB 8.9* 12/26/2015   HCT 27.3* 12/26/2015   MCV 81.3 12/26/2015   PLT 292 12/26/2015   No flowsheet data found.  Discharge instruction: per After Visit Summary and  "Baby and Me Booklet".  After visit meds:    Medication List    STOP taking these medications        acetaminophen 325 MG tablet  Commonly known as:  TYLENOL     calcium carbonate 500 MG chewable tablet  Commonly known as:  TUMS - dosed in mg elemental calcium     loratadine 10 MG tablet  Commonly known as:  CLARITIN      TAKE these medications        CITRANATAL HARMONY 27-1-260 MG Caps  Take 1 capsule by mouth daily.        Diet: routine diet  Activity: Advance as tolerated. Pelvic rest for 6 weeks.   Outpatient follow up:6 weeks Follow up Appt:No future appointments. Follow up Visit:No Follow-up on file.  Postpartum contraception: Tubal Ligation  Newborn Data: Live born female  Birth Weight: 6 lb 7 oz (2920 g) APGAR: 8, 9  Baby Feeding: Breast Disposition:home with mother   12/28/2015 Barbara Ok, MD

## 2015-12-28 NOTE — Lactation Note (Signed)
This note was copied from a baby's chart. Lactation Consultation Note: Mother states that infant is breastfeeding well. She is supplementing with EBM. Mother last  pumped 50 ml and infant took 30 ml after breastfeeding. Reviewed milk storage guidelines and supplemental guidelines. Mother has [facility]PI parent instruction sheet to refer to for perimeters . Mother states she is slightly sore. Staff nurse to give mother coconut oil. Advised mother to  Hand express colostrum and apply to nipples frequently. Discussed that infant will have normal cluster feeding periods for severe more days. Continue to feed infant 8-12 times in 24 hours. Mother advised in treatment to prevent severe engorgement. Mother informed of available [place] resources .   Patient Name: [name] [id number] Date: 12/28/2015     Maternal Data    Feeding Feeding Type: Breast Milk  LATCH Score/Interventions                      Lactation Tools Discussed/Used     Consult Status      Darla Lesches 12/28/2015, 10:28 AM

## 2015-12-28 NOTE — Discharge Instructions (Signed)
Breastfeeding Deciding to breastfeed is one of the best choices you can make for you and your baby. A change in hormones during pregnancy causes your breast tissue to grow and increases the number and size of your milk ducts. These hormones also allow proteins, sugars, and fats from your blood supply to make breast milk in your milk-producing glands. Hormones prevent breast milk from being released before your baby is born as well as prompt milk flow after birth. Once breastfeeding has begun, thoughts of your baby, as well as his or her sucking or crying, can stimulate the release of milk from your milk-producing glands.  BENEFITS OF BREASTFEEDING For Your Baby  Your first milk (colostrum) helps your baby's digestive system function better.  There are antibodies in your milk that help your baby fight off infections.  Your baby has a lower incidence of asthma, allergies, and sudden infant death syndrome.  The nutrients in breast milk are better for your baby than infant formulas and are designed uniquely for your baby's needs.  Breast milk improves your baby's brain development.  Your baby is less likely to develop other conditions, such as childhood obesity, asthma, or type 2 diabetes mellitus. For You  Breastfeeding helps to create a very special bond between you and your baby.  Breastfeeding is convenient. Breast milk is always available at the correct temperature and costs nothing.  Breastfeeding helps to burn calories and helps you lose the weight gained during pregnancy.  Breastfeeding makes your uterus contract to its prepregnancy size faster and slows bleeding (lochia) after you give birth.   Breastfeeding helps to lower your risk of developing type 2 diabetes mellitus, osteoporosis, and breast or ovarian cancer later in life. SIGNS THAT YOUR BABY IS HUNGRY Early Signs of Hunger  Increased alertness or activity.  Stretching.  Movement of the head from side to  side.  Movement of the head and opening of the mouth when the corner of the mouth or cheek is stroked (rooting).  Increased sucking sounds, smacking lips, cooing, sighing, or squeaking.  Hand-to-mouth movements.  Increased sucking of fingers or hands. Late Signs of Hunger  Fussing.  Intermittent crying. Extreme Signs of Hunger Signs of extreme hunger will require calming and consoling before your baby will be able to breastfeed successfully. Do not wait for the following signs of extreme hunger to occur before you initiate breastfeeding:  Restlessness.  A loud, strong cry.  Screaming. BREASTFEEDING BASICS Breastfeeding Initiation  Find a comfortable place to sit or lie down, with your neck and back well supported.  Place a pillow or rolled up blanket under your baby to bring him or her to the level of your breast (if you are seated). Nursing pillows are specially designed to help support your arms and your baby while you breastfeed.  Make sure that your baby's abdomen is facing your abdomen.  Gently massage your breast. With your fingertips, massage from your chest wall toward your nipple in a circular motion. This encourages milk flow. You may need to continue this action during the feeding if your milk flows slowly.  Support your breast with 4 fingers underneath and your thumb above your nipple. Make sure your fingers are well away from your nipple and your baby's mouth.  Stroke your baby's lips gently with your finger or nipple.  When your baby's mouth is open wide enough, quickly bring your baby to your breast, placing your entire nipple and as much of the colored area around your nipple (  areola) as possible into your baby's mouth.  More areola should be visible above your baby's upper lip than below the lower lip.  Your baby's tongue should be between his or her lower gum and your breast.  Ensure that your baby's mouth is correctly positioned around your nipple  (latched). Your baby's lips should create a seal on your breast and be turned out (everted).  It is common for your baby to suck about 2-3 minutes in order to start the flow of breast milk. Latching Teaching your baby how to latch on to your breast properly is very important. An improper latch can cause nipple pain and decreased milk supply for you and poor weight gain in your baby. Also, if your baby is not latched onto your nipple properly, he or she may swallow some air during feeding. This can make your baby fussy. Burping your baby when you switch breasts during the feeding can help to get rid of the air. However, teaching your baby to latch on properly is still the best way to prevent fussiness from swallowing air while breastfeeding. Signs that your baby has successfully latched on to your nipple:  Silent tugging or silent sucking, without causing you pain.  Swallowing heard between every 3-4 sucks.  Muscle movement above and in front of his or her ears while sucking. Signs that your baby has not successfully latched on to nipple:  Sucking sounds or smacking sounds from your baby while breastfeeding.  Nipple pain. If you think your baby has not latched on correctly, slip your finger into the corner of your baby's mouth to break the suction and place it between your baby's gums. Attempt breastfeeding initiation again. Signs of Successful Breastfeeding Signs from your baby:  A gradual decrease in the number of sucks or complete cessation of sucking.  Falling asleep.  Relaxation of his or her body.  Retention of a small amount of milk in his or her mouth.  Letting go of your breast by himself or herself. Signs from you:  Breasts that have increased in firmness, weight, and size 1-3 hours after feeding.  Breasts that are softer immediately after breastfeeding.  Increased milk volume, as well as a change in milk consistency and color by the fifth day of breastfeeding.  Nipples  that are not sore, cracked, or bleeding. Signs That Your [employer] is Getting Enough Milk  Wetting at least 3 diapers in a 24-hour period. The urine should be clear and pale yellow by age 67 days.  At least 3 stools in a 24-hour period by age 67 days. The stool should be soft and yellow.  At least 3 stools in a 24-hour period by age 76 days. The stool should be seedy and yellow.  No loss of weight greater than 10% of birth weight during the first 48 days of age.  Average weight gain of 4-7 ounces (113-198 g) per week after age 18 days.  Consistent daily weight gain by age 68 days, without weight loss after the age of 2 weeks. After a feeding, your baby may spit up a small amount. This is common. BREASTFEEDING FREQUENCY AND DURATION Frequent feeding will help you make more milk and can prevent sore nipples and breast engorgement. Breastfeed when you feel the need to reduce the fullness of your breasts or when your baby shows signs of hunger. This is called "breastfeeding on demand." Avoid introducing a pacifier to your baby while you are working to establish breastfeeding (the first 4-6 weeks  after your baby is born). After this time you may choose to use a pacifier. Research has shown that pacifier use during the first year of a baby's life decreases the risk of sudden infant death syndrome (SIDS). Allow your baby to feed on each breast as long as he or she wants. Breastfeed until your baby is finished feeding. When your baby unlatches or falls asleep while feeding from the first breast, offer the second breast. Because newborns are often sleepy in the first few weeks of life, you may need to awaken your baby to get him or her to feed. Breastfeeding times will vary from baby to baby. However, the following rules can serve as a guide to help you ensure that your baby is properly fed:  Newborns (babies 65 weeks of age or younger) may breastfeed every 1-3 hours.  Newborns should not go longer than 3 hours  during the day or 5 hours during the night without breastfeeding.  You should breastfeed your baby a minimum of 8 times in a 24-hour period until you begin to introduce solid foods to your baby at around 69 months of age. BREAST MILK PUMPING Pumping and storing breast milk allows you to ensure that your baby is exclusively fed your breast milk, even at times when you are unable to breastfeed. This is especially important if you are going back to work while you are still breastfeeding or when you are not able to be present during feedings. Your lactation consultant can give you guidelines on how long it is safe to store breast milk. A breast pump is a machine that allows you to pump milk from your breast into a sterile bottle. The pumped breast milk can then be stored in a refrigerator or freezer. Some breast pumps are operated by hand, while others use electricity. Ask your lactation consultant which type will work best for you. Breast pumps can be purchased, but some hospitals and breastfeeding support groups lease breast pumps on a monthly basis. A lactation consultant can teach you how to hand express breast milk, if you prefer not to use a pump. CARING FOR YOUR BREASTS WHILE YOU BREASTFEED Nipples can become dry, cracked, and sore while breastfeeding. The following recommendations can help keep your breasts moisturized and healthy:  Avoid using soap on your nipples.  Wear a supportive bra. Although not required, special nursing bras and tank tops are designed to allow access to your breasts for breastfeeding without taking off your entire bra or top. Avoid wearing underwire-style bras or extremely tight bras.  Air dry your nipples for 3-23mnutes after each feeding.  Use only cotton bra pads to absorb leaked breast milk. Leaking of breast milk between feedings is normal.  Use lanolin on your nipples after breastfeeding. Lanolin helps to maintain your skin's normal moisture barrier. If you use  pure lanolin, you do not need to wash it off before feeding your baby again. Pure lanolin is not toxic to your baby. You may also hand express a few drops of breast milk and gently massage that milk into your nipples and allow the milk to air dry. In the first few weeks after giving birth, some women experience extremely full breasts (engorgement). Engorgement can make your breasts feel heavy, warm, and tender to the touch. Engorgement peaks within 3-5 days after you give birth. The following recommendations can help ease engorgement:  Completely empty your breasts while breastfeeding or pumping. You may want to start by applying warm, moist heat (in  the shower or with warm water-soaked hand towels) just before feeding or pumping. This increases circulation and helps the milk flow. If your baby does not completely empty your breasts while breastfeeding, pump any extra milk after he or she is finished.  Wear a snug bra (nursing or regular) or tank top for 1-2 days to signal your body to slightly decrease milk production.  Apply ice packs to your breasts, unless this is too uncomfortable for you.  Make sure that your baby is latched on and positioned properly while breastfeeding. If engorgement persists after 48 hours of following these recommendations, contact your health care provider or a Science writer. OVERALL HEALTH CARE RECOMMENDATIONS WHILE BREASTFEEDING  Eat healthy foods. Alternate between meals and snacks, eating 3 of each per day. Because what you eat affects your breast milk, some of the foods may make your baby more irritable than usual. Avoid eating these foods if you are sure that they are negatively affecting your baby.  Drink milk, fruit juice, and water to satisfy your thirst (about 10 glasses a day).  Rest often, relax, and continue to take your prenatal vitamins to prevent fatigue, stress, and anemia.  Continue breast self-awareness checks.  Avoid chewing and smoking  tobacco. Chemicals from cigarettes that pass into breast milk and exposure to secondhand smoke may harm your baby.  Avoid alcohol and drug use, including marijuana. Some medicines that may be harmful to your baby can pass through breast milk. It is important to ask your health care provider before taking any medicine, including all over-the-counter and prescription medicine as well as vitamin and herbal supplements. It is possible to become pregnant while breastfeeding. If birth control is desired, ask your health care provider about options that will be safe for your baby. SEEK MEDICAL CARE IF:  You feel like you want to stop breastfeeding or have become frustrated with breastfeeding.  You have painful breasts or nipples.  Your nipples are cracked or bleeding.  Your breasts are red, tender, or warm.  You have a swollen area on either breast.  You have a fever or chills.  You have nausea or vomiting.  You have drainage other than breast milk from your nipples.  Your breasts do not become full before feedings by the fifth day after you give birth.  You feel sad and depressed.  Your baby is too sleepy to eat well.  Your baby is having trouble sleeping.   Your baby is wetting less than 3 diapers in a 24-hour period.  Your baby has less than 3 stools in a 24-hour period.  Your baby's skin or the white part of his or her eyes becomes yellow.   Your baby is not gaining weight by 22 days of age. SEEK IMMEDIATE MEDICAL CARE IF:  Your baby is overly tired (lethargic) and does not want to wake up and feed.  Your baby develops an unexplained fever.   This information is not intended to replace advice given to you by your health care provider. Make sure you discuss any questions you have with your health care provider.   Document Released: 05/25/2005 Document Revised: 02/13/2015 Document Reviewed: 11/16/2012 Elsevier Interactive Patient Education 2016 Else[employer] Inc[employer]   Cesarean  Delivery, Care After Refer to this sheet in the next few weeks. These instructions provide you with information on caring for yourself after your procedure. Your health care provider may also give you specific instructions. Your treatment has been planned according to current medical practices, but problems  sometimes occur. Call your health care provider if you have any problems or questions after you go home. HOME CARE INSTRUCTIONS  Only take over-the-counter or prescription medications as directed by your health care provider.  Do not drink alcohol, especially if you are breastfeeding or taking medication to relieve pain.  Do not chew or smoke tobacco.  Continue to use good perineal care. Good perineal care includes:  Wiping your perineum from front to back.  Keeping your perineum clean.  Check your surgical cut (incision) daily for increased redness, drainage, swelling, or separation of skin.  Clean your incision gently with soap and water every day, and then pat it dry. If your health care provider says it is okay, leave the incision uncovered. Use a bandage (dressing) if the incision is draining fluid or appears irritated. If the adhesive strips across the incision do not fall off within 7 days, carefully peel them off.  Hug a pillow when coughing or sneezing until your incision is healed. This helps to relieve pain.  Do not use tampons or douche until your health care provider says it is okay.  Shower, wash your hair, and take tub baths as directed by your health care provider.  Wear a well-fitting bra that provides breast support.  Limit wearing support panties or control-top hose.  Drink enough fluids to keep your urine clear or pale yellow.  Eat high-fiber foods such as whole grain cereals and breads, brown rice, beans, and fresh fruits and vegetables every day. These foods may help prevent or relieve constipation.  Resume activities such as climbing stairs, driving,  lifting, exercising, or traveling as directed by your health care provider.  Talk to your health care provider about resuming sexual activities. This is dependent upon your risk of infection, your rate of healing, and your comfort and desire to resume sexual activity.  Try to have someone help you with your household activities and your newborn for at least a few days after you leave the hospital.  Rest as much as possible. Try to rest or take a nap when your newborn is sleeping.  Increase your activities gradually.  Keep all of your scheduled postpartum appointments. It is very important to keep your scheduled follow-up appointments. At these appointments, your health care provider will be checking to make sure that you are healing physically and emotionally. SEEK MEDICAL CARE IF:   You are passing large clots from your vagina. Save any clots to show your health care provider.  You have a foul smelling discharge from your vagina.  You have trouble urinating.  You are urinating frequently.  You have pain when you urinate.  You have a change in your bowel movements.  You have increasing redness, pain, or swelling near your incision.  You have pus draining from your incision.  Your incision is separating.  You have painful, hard, or reddened breasts.  You have a severe headache.  You have blurred vision or see spots.  You feel sad or depressed.  You have thoughts of hurting yourself or your newborn.  You have questions about your care, the care of your newborn, or medications.  You are dizzy or light-headed.  You have a rash.  You have pain, redness, or swelling at the site of the removed intravenous access (IV) tube.  You have nausea or vomiting.  You stopped breastfeeding and have not had a menstrual period within 12 weeks of stopping.  You are not breastfeeding and have not had a menstrual  period within 12 weeks of delivery.  You have a fever. SEEK IMMEDIATE  MEDICAL CARE IF:  You have persistent pain.  You have chest pain.  You have shortness of breath.  You faint.  You have leg pain.  You have stomach pain.  Your vaginal bleeding saturates 2 or more sanitary pads in 1 hour. MAKE SURE YOU:   Understand these instructions.  Will watch your condition.  Will get help right away if you are not doing well or get worse.   This information is not intended to replace advice given to you by your health care provider. Make sure you discuss any questions you have with your health care provider.   Document Released: 02/14/2002 Document Revised: 06/15/2014 Document Reviewed: 01/20/2012 Elsevier Interactive Patient Education [employer].

## 2016-01-09 ENCOUNTER — Ambulatory Visit (INDEPENDENT_AMBULATORY_CARE_PROVIDER_SITE_OTHER): Payer: BLUE CROSS/BLUE SHIELD | Admitting: Obstetrics

## 2016-01-09 DIAGNOSIS — Z1389 Encounter for screening for other disorder: Secondary | ICD-10-CM

## 2016-01-09 LAB — POCT URINALYSIS DIPSTICK
BILIRUBIN UA: NEGATIVE
GLUCOSE UA: NEGATIVE
KETONES UA: NEGATIVE
Leukocytes, UA: NEGATIVE
Nitrite, UA: NEGATIVE
PH UA: 5
Protein, UA: NEGATIVE
RBC UA: NEGATIVE
SPEC GRAV UA: 1.015
Urobilinogen, UA: 0.2

## 2016-01-14 ENCOUNTER — Encounter: Payer: Self-pay | Admitting: Obstetrics

## 2016-01-15 ENCOUNTER — Encounter: Payer: Self-pay | Admitting: Obstetrics

## 2016-01-15 NOTE — Progress Notes (Signed)
Subjective:     Barbara Morales is a 40 y.o. female who presents for a postpartum visit. She is 2 weeks postpartum following a low cervical transverse Cesarean section. I have fully reviewed the prenatal and intrapartum course. The delivery was at 72 gestational weeks. Outcome: primary cesarean section, low transverse incision. Anesthesia: spinal. Postpartum course has been normal. Baby's course has been normal. Baby is feeding by breast. Bleeding thin lochia. Bowel function is normal. Bladder function is normal. Patient is not sexually active. Contraception method is none. Postpartum depression screening: negative.  Tobacco, alcohol and substance abuse history reviewed.  Adult immunizations reviewed including TDAP, rubella and varicella.  The following portions of the patient's history were reviewed and updated as appropriate: allergies, current medications, past family history, past medical history, past social history, past surgical history and problem list.  Review of Systems A comprehensive review of systems was negative.   Objective: weeks postpartum.    BP 122/87   Pulse 76   Temp 98.4 F (36.9 C) (Oral)   Wt 234 lb (106.1 kg)   LMP 04/10/2015   BMI 38.35 kg/m    PE:  Deferred   100% of 10 min visit spent on counseling and coordination of care.   Assessment:    2 weeks postpartum.  Doing well  Contraceptive counseling and advice.  Plan:    1. Contraception: Considering options 2. Continue PNV's 3. Follow up in: 4 weeks or as needed.   Healthy lifestyle practices reviewed

## 2016-02-06 ENCOUNTER — Ambulatory Visit (INDEPENDENT_AMBULATORY_CARE_PROVIDER_SITE_OTHER): Payer: BLUE CROSS/BLUE SHIELD | Admitting: Obstetrics

## 2016-02-06 ENCOUNTER — Encounter: Payer: Self-pay | Admitting: Obstetrics

## 2016-02-06 LAB — POCT URINALYSIS DIPSTICK
Bilirubin, UA: NEGATIVE
Blood, UA: NEGATIVE
Glucose, UA: NEGATIVE
Ketones, UA: NEGATIVE
LEUKOCYTES UA: NEGATIVE
NITRITE UA: NEGATIVE
PH UA: 5
PROTEIN UA: NEGATIVE
Spec Grav, UA: 1.02
Urobilinogen, UA: 0.2

## 2016-02-06 NOTE — Addendum Note (Signed)
Addended by: Dorothyann Gibbs on: 02/06/2016 04:07 PM   Modules accepted: Orders

## 2016-02-06 NOTE — Progress Notes (Signed)
Subjective:     Barbara Morales is a 40 y.o. female who presents for a postpartum visit. She is 6 weeks postpartum following a low cervical transverse Cesarean section and BTL. I have fully reviewed the prenatal and intrapartum course. The delivery was at 76 gestational weeks. Outcome: repeat cesarean section, low transverse incision. Anesthesia: spinal. Postpartum course has been normal. Baby's course has been normal. Baby is feeding by both breast and bottle - Similac Advance. Bleeding no bleeding. Bowel function is normal. Bladder function is normal. Patient is not sexually active. Contraception method is abstinence. Postpartum depression screening: negative.  Tobacco, alcohol and substance abuse history reviewed.  Adult immunizations reviewed including TDAP, rubella and varicella.  The following portions of the patient's history were reviewed and updated as appropriate: allergies, current medications, past family history, past medical history, past social history, past surgical history and problem list.  Review of Systems A comprehensive review of systems was negative.   Objective:    BP 127/83   Pulse 71   Wt 237 lb (107.5 kg)   Breastfeeding? Yes   BMI 38.84 kg/m   General:  alert and no distress   Breasts:  inspection negative, no nipple discharge or bleeding, no masses or nodularity palpable  Lungs: clear to auscultation bilaterally  Heart:  regular rate and rhythm, S1, S2 normal, no murmur, click, rub or gallop  Abdomen: soft, non-tender; bowel sounds normal; no masses,  no organomegaly   Vulva:  normal  Vagina: normal vagina  Cervix:  no cervical motion tenderness  Corpus: normal size, contour, position, consistency, mobility, non-tender  Adnexa:  no mass, fullness, tenderness  Rectal Exam: Not performed.          50% of 20 min visit spent on counseling and coordination of care.   Assessment:     Normal postpartum exam. Pap smear not done at today's visit.  Plan:    1.  Contraception: tubal ligation 2. Continue PNV's 3. Follow up in: several months or as needed.  2hr GTT for h/o GDM/screening for DM q 3 yrs per ADA recommendations Preconception counseling provided Healthy lifestyle practices reviewed

## 2016-02-07 ENCOUNTER — Encounter: Payer: Self-pay | Admitting: Obstetrics

## 2016-02-11 ENCOUNTER — Encounter: Payer: Self-pay | Admitting: *Deleted

## 2016-02-11 ENCOUNTER — Encounter (HOSPITAL_COMMUNITY): Payer: Self-pay | Admitting: Obstetrics

## 2016-02-11 NOTE — Addendum Note (Signed)
Addendum  created 02/11/16 2030 by Lyn Hollingshead, MD   Anesthesia Event edited, Anesthesia Staff edited

## 2017-07-21 ENCOUNTER — Other Ambulatory Visit: Payer: Self-pay | Admitting: Family Medicine

## 2017-07-21 DIAGNOSIS — Z1231 Encounter for screening mammogram for malignant neoplasm of breast: Secondary | ICD-10-CM

## 2017-09-13 ENCOUNTER — Ambulatory Visit: Payer: BLUE CROSS/BLUE SHIELD

## 2017-10-11 ENCOUNTER — Ambulatory Visit: Payer: BLUE CROSS/BLUE SHIELD

## 2017-10-18 ENCOUNTER — Ambulatory Visit
Admission: RE | Admit: 2017-10-18 | Discharge: 2017-10-18 | Disposition: A | Discharge status: Home / Self Care | Payer: BLUE CROSS/BLUE SHIELD | Source: Ambulatory Visit | Attending: Family Medicine | Admitting: Family Medicine

## 2017-10-18 DIAGNOSIS — Z1231 Encounter for screening mammogram for malignant neoplasm of breast: Secondary | ICD-10-CM

## 2018-05-30 ENCOUNTER — Ambulatory Visit: Payer: BLUE CROSS/BLUE SHIELD | Admitting: Psychology

## 2018-05-30 DIAGNOSIS — F411 Generalized anxiety disorder: Secondary | ICD-10-CM

## 2018-06-13 ENCOUNTER — Ambulatory Visit: Payer: BLUE CROSS/BLUE SHIELD | Admitting: Psychology

## 2018-06-13 DIAGNOSIS — F411 Generalized anxiety disorder: Secondary | ICD-10-CM

## 2018-06-27 ENCOUNTER — Ambulatory Visit: Payer: BLUE CROSS/BLUE SHIELD | Admitting: Psychology

## 2018-06-27 DIAGNOSIS — F411 Generalized anxiety disorder: Secondary | ICD-10-CM

## 2018-08-04 DIAGNOSIS — F329 Major depressive disorder, single episode, unspecified: Secondary | ICD-10-CM | POA: Insufficient documentation

## 2019-02-07 ENCOUNTER — Other Ambulatory Visit: Payer: Self-pay | Admitting: Family Medicine

## 2019-02-07 DIAGNOSIS — Z1231 Encounter for screening mammogram for malignant neoplasm of breast: Secondary | ICD-10-CM

## 2019-03-23 ENCOUNTER — Other Ambulatory Visit: Payer: Self-pay

## 2019-03-23 ENCOUNTER — Ambulatory Visit
Admission: RE | Admit: 2019-03-23 | Discharge: 2019-03-23 | Disposition: A | Discharge status: Home / Self Care | Payer: BC Managed Care – PPO | Source: Ambulatory Visit | Attending: Family Medicine | Admitting: Family Medicine

## 2019-03-23 DIAGNOSIS — Z1231 Encounter for screening mammogram for malignant neoplasm of breast: Secondary | ICD-10-CM

## 2019-06-05 ENCOUNTER — Ambulatory Visit: Payer: BC Managed Care – PPO | Attending: Internal Medicine

## 2019-06-05 DIAGNOSIS — Z20822 Contact with and (suspected) exposure to covid-19: Secondary | ICD-10-CM

## 2019-06-07 LAB — NOVEL CORONAVIRUS, NAA: SARS-CoV-2, NAA: DETECTED — AB

## 2020-03-27 ENCOUNTER — Other Ambulatory Visit: Payer: Self-pay | Admitting: Family Medicine

## 2020-03-27 DIAGNOSIS — Z1231 Encounter for screening mammogram for malignant neoplasm of breast: Secondary | ICD-10-CM

## 2020-04-25 ENCOUNTER — Ambulatory Visit: Payer: BC Managed Care – PPO

## 2020-04-25 ENCOUNTER — Other Ambulatory Visit: Payer: Self-pay

## 2020-04-25 ENCOUNTER — Ambulatory Visit
Admission: RE | Admit: 2020-04-25 | Discharge: 2020-04-25 | Disposition: A | Discharge status: Home / Self Care | Payer: BC Managed Care – PPO | Source: Ambulatory Visit | Attending: Family Medicine | Admitting: Family Medicine

## 2020-04-25 DIAGNOSIS — Z1231 Encounter for screening mammogram for malignant neoplasm of breast: Secondary | ICD-10-CM

## 2021-04-10 ENCOUNTER — Other Ambulatory Visit: Payer: Self-pay | Admitting: Family Medicine

## 2021-04-10 DIAGNOSIS — Z1231 Encounter for screening mammogram for malignant neoplasm of breast: Secondary | ICD-10-CM

## 2021-04-23 ENCOUNTER — Encounter: Payer: Self-pay | Admitting: Gastroenterology

## 2021-05-13 ENCOUNTER — Ambulatory Visit
Admission: RE | Admit: 2021-05-13 | Discharge: 2021-05-13 | Disposition: A | Discharge status: Home / Self Care | Payer: BC Managed Care – PPO | Source: Ambulatory Visit | Attending: Family Medicine | Admitting: Family Medicine

## 2021-05-13 DIAGNOSIS — Z1231 Encounter for screening mammogram for malignant neoplasm of breast: Secondary | ICD-10-CM

## 2021-06-11 ENCOUNTER — Ambulatory Visit (AMBULATORY_SURGERY_CENTER): Payer: BC Managed Care – PPO | Admitting: *Deleted

## 2021-06-11 ENCOUNTER — Other Ambulatory Visit: Payer: Self-pay

## 2021-06-11 VITALS — Ht 65.75 in | Wt 240.0 lb

## 2021-06-11 DIAGNOSIS — Z1211 Encounter for screening for malignant neoplasm of colon: Secondary | ICD-10-CM

## 2021-06-11 MED ORDER — PLENVU 140 G PO SOLR: 1.0000 | Freq: Once | ORAL | 0 refills | Status: AC

## 2021-06-11 NOTE — Progress Notes (Signed)
No egg or soy allergy known to patient  No issues known to pt with past sedation with any surgeries or procedures Patient denies ever being told they had issues or difficulty with intubation  No FH of Malignant Hyperthermia Pt is not on diet pills Pt is not on  home 02  Pt is not on blood thinners  Pt denies issues with constipation  No A fib or A flutter  Pt is fully vaccinated  for Covid   Plenu  Coupon given to pt in [facility] today , Code to Pharmacy and  NO PA's for preps discussed with pt In [facility] today  Discussed with pt there will be an out-of-pocket cost for prep and that varies from $0 to 70 +  dollars - pt verbalized understanding   Due to the COVID-19 pandemic we are asking patients to follow certain guidelines in [facility] and the [place]   Pt aware of COVID protocols and [facility] guidelines    Pt. Stated " I have been off phentermine for 3 weeks and I am not going to start it back until after the procedure". Verbalized understanding that the diet pill has to held.

## 2021-06-19 ENCOUNTER — Encounter: Payer: Self-pay | Admitting: Gastroenterology

## 2021-06-25 ENCOUNTER — Encounter: Payer: Self-pay | Admitting: Gastroenterology

## 2021-06-25 ENCOUNTER — Ambulatory Visit (AMBULATORY_SURGERY_CENTER): Payer: BC Managed Care – PPO | Admitting: Gastroenterology

## 2021-06-25 ENCOUNTER — Other Ambulatory Visit: Payer: Self-pay

## 2021-06-25 VITALS — BP 107/53 | HR 78 | Temp 97.7°F | Resp 18 | Ht 65.75 in | Wt 240.0 lb

## 2021-06-25 DIAGNOSIS — D123 Benign neoplasm of transverse colon: Secondary | ICD-10-CM

## 2021-06-25 DIAGNOSIS — Z1211 Encounter for screening for malignant neoplasm of colon: Secondary | ICD-10-CM

## 2021-06-25 DIAGNOSIS — Z800 Family history of malignant neoplasm of digestive organs: Secondary | ICD-10-CM | POA: Diagnosis not present

## 2021-06-25 MED ORDER — SODIUM CHLORIDE 0.9 % IV SOLN: 500.0000 mL | Freq: Once | INTRAVENOUS | Status: DC

## 2021-06-25 NOTE — Progress Notes (Signed)
Called to room to assist during endoscopic procedure.  Patient ID and intended procedure confirmed with present staff. Received instructions for my participation in the procedure from the performing physician.  

## 2021-06-25 NOTE — Patient Instructions (Signed)
1 polyps removed- await pathology  Please read over handout about polyps  Continue your normal medications  Emerging evidence supports eating a diet of fruits, vegetables, grains, calcium, and yogurt while reducing red meat and alcohol may reduce the risk of colon cancer   YOU HAD AN ENDOSCOPIC PROCEDURE TODAY AT [facility]:   Refer to the procedure report that was given to you for any specific questions about what was found during the examination.  If the procedure report does not answer your questions, please call your gastroenterologist to clarify.  If you requested that your care partner not be given the details of your procedure findings, then the procedure report has been included in a sealed envelope for you to review at your convenience later.  YOU SHOULD EXPECT: Some feelings of bloating in the abdomen. Passage of more gas than usual.  Walking can help get rid of the air that was put into your GI tract during the procedure and reduce the bloating. If you had a lower endoscopy (such as a colonoscopy or flexible sigmoidoscopy) you may notice spotting of blood in your stool or on the toilet paper. If you underwent a bowel prep for your procedure, you may not have a normal bowel movement for a few days.  Please Note:  You might notice some irritation and congestion in your nose or some drainage.  This is from the oxygen used during your procedure.  There is no need for concern and it should clear up in a day or so.  SYMPTOMS TO REPORT IMMEDIATELY:  Following lower endoscopy (colonoscopy or flexible sigmoidoscopy):  Excessive amounts of blood in the stool  Significant tenderness or worsening of abdominal pains  Swelling of the abdomen that is new, acute  Fever of 100F or higher  For urgent or emergent issues, a gastroenterologist can be reached at any hour by calling [phone]. Do not use MyChart messaging for urgent concerns.    DIET:  We do recommend a small  meal at first, but then you may proceed to your regular diet.  Drink plenty of fluids but you should avoid alcoholic beverages for 24 hours.  ACTIVITY:  You should plan to take it easy for the rest of today and you should NOT DRIVE or use heavy machinery until tomorrow (because of the sedation medicines used during the test).    FOLLOW UP: Our staff will call the number listed on your records 48-72 hours following your procedure to check on you and address any questions or concerns that you may have regarding the information given to you following your procedure. If we do not reach you, we will leave a message.  We will attempt to reach you two times.  During this call, we will ask if you have developed any symptoms of COVID 19. If you develop any symptoms (ie: fever, flu-like symptoms, shortness of breath, cough etc.) before then, please call [phone].  If you test positive for Covid 19 in the 2 weeks post procedure, please call and report this information to [place].    If any biopsies were taken you will be contacted by phone or by letter within the next 1-3 weeks.  Please call us at [phone] if you have not heard about the biopsies in 3 weeks.    SIGNATURES/CONFIDENTIALITY: You and/or your care partner have signed paperwork which will be entered into your electronic medical record.  These signatures attest to the fact that that the information above on  your After Visit Summary has been reviewed and is understood.  Full responsibility of the confidentiality of this discharge information lies with you and/or your care-partner.

## 2021-06-25 NOTE — Progress Notes (Signed)
To pacu, VSS. Report to [email or web address] 

## 2021-06-25 NOTE — Progress Notes (Signed)
VS by DT  Pt's states no medical or surgical changes since previsit or office visit.  

## 2021-06-25 NOTE — Progress Notes (Signed)
° °  Referring Provider: Bernerd Limbo, MD Primary Care Physician:  Bernerd Limbo, MD  Reason for Procedure:  Colon cancer screening   IMPRESSION:  Need for colon cancer screening Appropriate candidate for monitored anesthesia care  PLAN: Colonoscopy in the [facility] today   HPI: Barbara Morales is a 46 y.o. female presents for screening colonoscopy.  No prior colonoscopy or colon cancer screening.  No baseline GI symptoms.   Paternal and maternal grandfathers with colon cancer - one in his 22s and the other at an advanced age. No other known family history of colon cancer or polyps. No family history of uterine/endometrial cancer, pancreatic cancer or gastric/stomach cancer.   Past Medical History:  Diagnosis Date   Anxiety    Depression    Heart murmur    as child,"very slight"   Medical history non-contributory    Vaginal delivery 1998, 2001, 2002    Past Surgical History:  Procedure Laterality Date   CESAREAN SECTION N/A 12/25/2015   Procedure: CESAREAN SECTION;  Surgeon: Shelly Bombard, MD;  Location: [facility];  Service: Obstetrics;  Laterality: N/A;   NO PAST SURGERIES     TUBAL LIGATION Bilateral 12/25/2015   Procedure: BILATERAL TUBAL LIGATION;  Surgeon: Shelly Bombard, MD;  Location: [facility];  Service: Obstetrics;  Laterality: Bilateral;    Current Outpatient Medications  Medication Sig Dispense Refill   citalopram (CELEXA) 20 MG tablet Take by mouth. (Patient not taking: Reported on 06/11/2021)     phentermine (ADIPEX-P) 37.5 MG tablet Take 37.5 mg by mouth daily as needed.     Current Facility-Administered Medications  Medication Dose Route Frequency Provider Last Rate Last Admin   0.9 %  sodium chloride infusion  500 mL Intravenous Once Thornton Park, MD        Allergies as of 06/25/2021 - Review Complete 06/25/2021  Allergen Reaction Noted   Penicillins Hives 07/01/2015    Family History  Problem Relation Age of Onset    Colon cancer Maternal Grandfather    Colon cancer Paternal Grandfather    Breast cancer Maternal Great-grandmother    Stomach cancer Cousin    Esophageal cancer Neg Hx    Rectal cancer Neg Hx      Physical Exam: General:   Alert,  well-nourished, pleasant and cooperative in NAD Head:  Normocephalic and atraumatic. Eyes:  Sclera clear, no icterus.   Conjunctiva pink. Mouth:  No deformity or lesions.   Neck:  Supple; no masses or thyromegaly. Lungs:  Clear throughout to auscultation.   No wheezes. Heart:  Regular rate and rhythm; no murmurs. Abdomen:  Soft, non-tender, nondistended, normal bowel sounds, no rebound or guarding.  Msk:  Symmetrical. No boney deformities LAD: No inguinal or umbilical LAD Extremities:  No clubbing or edema. Neurologic:  Alert and  oriented x4;  grossly nonfocal Skin:  No obvious rash or bruise. Psych:  Alert and cooperative. Normal mood and affect.     Studies/Results: No results found.    Kailan Carmen L. Tarri Glenn, MD, MPH 06/25/2021, 9:26 AM

## 2021-06-25 NOTE — Op Note (Signed)
[facility] Patient Name: Barbara Morales Procedure Date: 06/25/2021 9:32 AM MRN: [id number] Endoscopist: Thornton Park MD, MD Age: 46 Referring MD:  Date of Birth: Feb 01, 1976 Gender: Female Account #: [id number] Procedure:                Colonoscopy Indications:              Screening for colorectal malignant neoplasm, Colon                            cancer screening in patient at increased risk:                            Family history of colorectal cancer in multiple 2nd                            degree relatives (both paternal and maternal                            grandfathers), This is the patient's first                            colonoscopy Medicines:                Monitored Anesthesia Care Procedure:                Pre-Anesthesia Assessment:                           - Prior to the procedure, a History and Physical                            was performed, and patient medications and                            allergies were reviewed. The patient's tolerance of                            previous anesthesia was also reviewed. The risks                            and benefits of the procedure and the sedation                            options and risks were discussed with the patient.                            All questions were answered, and informed consent                            was obtained. Prior Anticoagulants: The patient has                            taken no previous anticoagulant or antiplatelet  agents. ASA Grade Assessment: II - A patient with                            mild systemic disease. After reviewing the risks                            and benefits, the patient was deemed in                            satisfactory condition to undergo the procedure.                           After obtaining informed consent, the colonoscope                            was passed under direct vision. Throughout the                             procedure, the patient's blood pressure, pulse, and                            oxygen saturations were monitored continuously. The                            CF [id number] #[id number] was introduced through the anus                            and advanced to the 2 cm into the ileum. A second                            forward view of the right colon was performed. The                            colonoscopy was performed without difficulty. The                            patient tolerated the procedure well. The quality                            of the bowel preparation was good. The ileocecal                            valve, appendiceal orifice, and rectum were                            photographed. Scope In: 9:37:54 AM Scope Out: 9:52:52 AM Scope Withdrawal Time: 0 hours 10 minutes 4 seconds  Total Procedure Duration: 0 hours 14 minutes 58 seconds  Findings:                 The perianal and digital rectal examinations were                            normal.  A 2 mm polyp was found in the hepatic flexure. The                            polyp was flat. The polyp was removed with a cold                            snare. Resection and retrieval were complete.                            Estimated blood loss was minimal.                           The exam was otherwise without abnormality on                            direct and retroflexion views. Complications:            No immediate complications. Estimated blood loss:                            Minimal. Estimated Blood Loss:     Estimated blood loss was minimal. Impression:               - One 2 mm polyp at the hepatic flexure, removed                            with a cold snare. Resected and retrieved.                           - The examination was otherwise normal on direct                            and retroflexion views. Recommendation:           - Patient has a contact number available for                             emergencies. The signs and symptoms of potential                            delayed complications were discussed with the                            patient. Return to normal activities tomorrow.                            Written discharge instructions were provided to the                            patient.                           - Resume previous diet.                           - Continue present medications.                           -  Await pathology results.                           - Repeat colonoscopy date to be determined after                            pending pathology results are reviewed for                            surveillance.                           - Emerging evidence supports eating a diet of                            fruits, vegetables, grains, calcium, and yogurt                            while reducing red meat and alcohol may reduce the                            risk of colon cancer.                           - Thank you for allowing me to be involved in your                            colon cancer prevention. Thornton Park MD, MD 06/25/2021 9:56:47 AM This report has been signed electronically.

## 2021-06-27 ENCOUNTER — Telehealth: Payer: Self-pay

## 2021-06-27 ENCOUNTER — Encounter: Payer: Self-pay | Admitting: Gastroenterology

## 2021-06-27 NOTE — Telephone Encounter (Signed)
°  Follow up Call-  Call back number 06/25/2021  Post procedure Call Back phone  # [phone]  Permission to leave phone message Yes  Some recent data might be hidden     Patient questions:  Do you have a fever, pain , or abdominal swelling? No. Pain Score  0 *  Have you tolerated food without any problems? Yes.    Have you been able to return to your normal activities? Yes.    Do you have any questions about your discharge instructions: Diet   No. Medications  No. Follow up visit  No.  Do you have questions or concerns about your Care? No.  Actions: * If pain score is 4 or above: No action needed, pain <4.

## 2021-11-27 DIAGNOSIS — F411 Generalized anxiety disorder: Secondary | ICD-10-CM | POA: Insufficient documentation

## 2022-01-13 IMAGING — MG DIGITAL SCREENING BILAT W/ TOMO W/ CAD
8 series · 8 of 24 positions shown · non-contrast
Comparison: Previous exam(s).

CLINICAL DATA: Screening.

EXAM:
DIGITAL SCREENING BILATERAL MAMMOGRAM WITH TOMO AND CAD

[R MLO synth-2D]
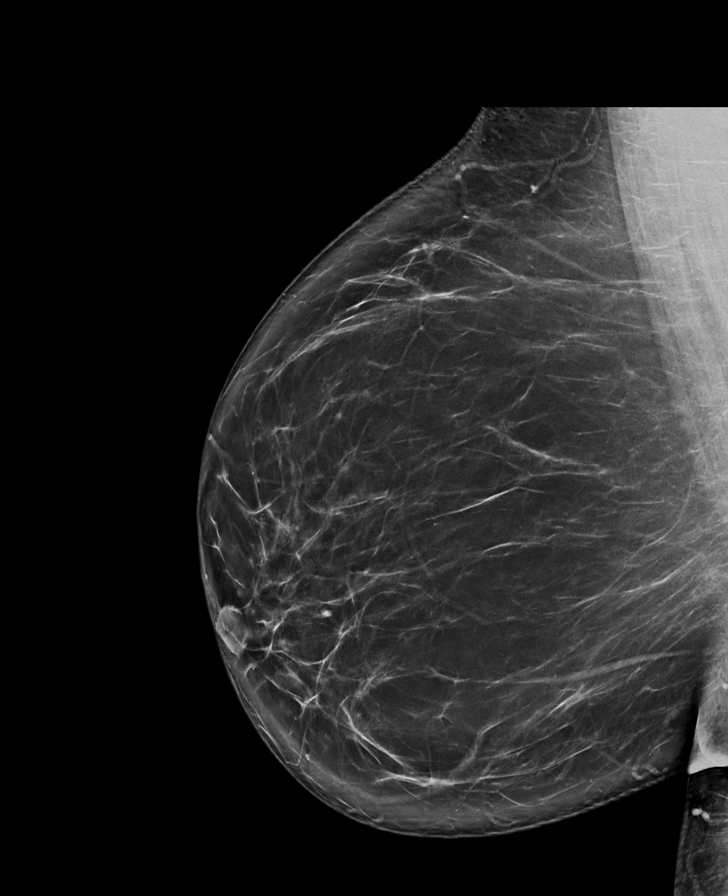

[R CC synth-2D]
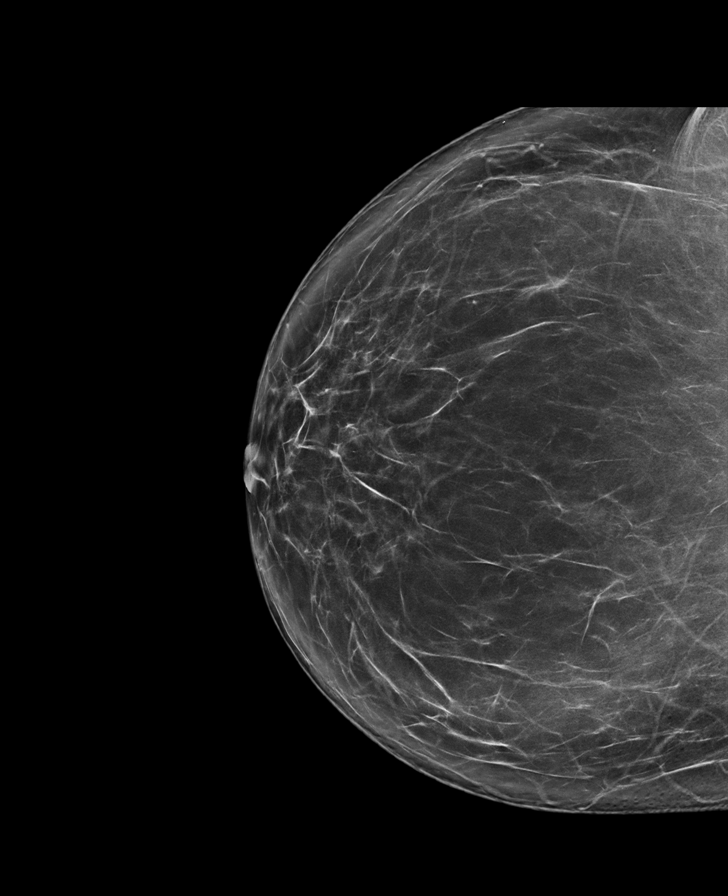

[L CC synth-2D]
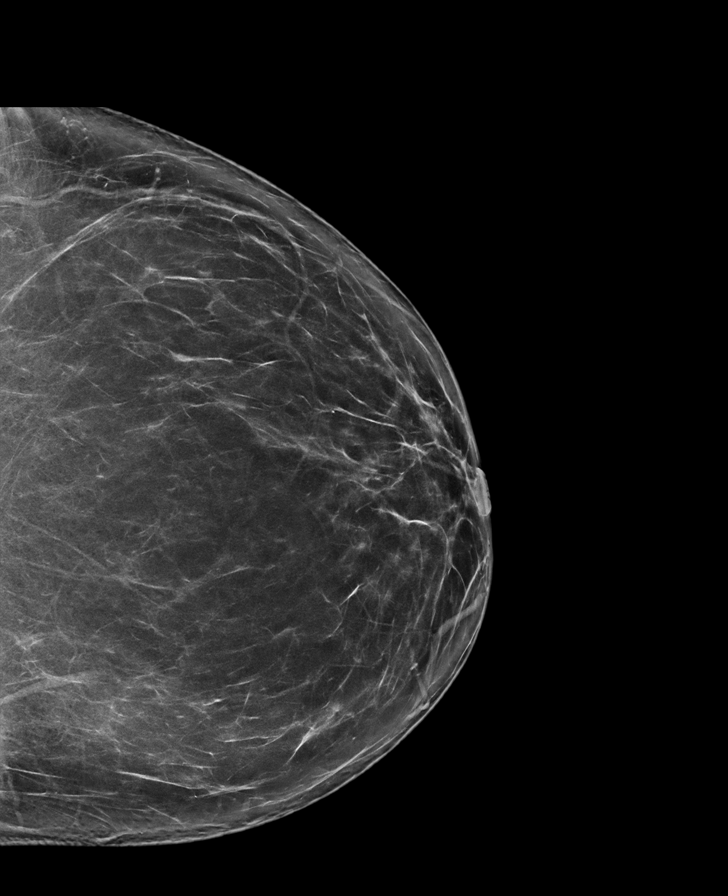

[L MLO synth-2D]
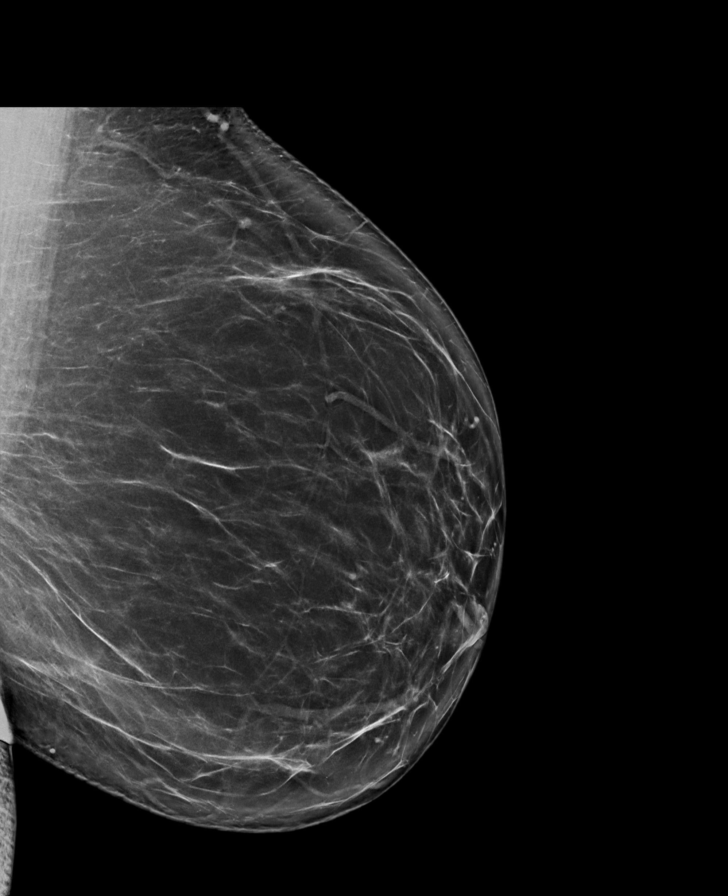

[L CC tomo · tomo slice 43/84.0]
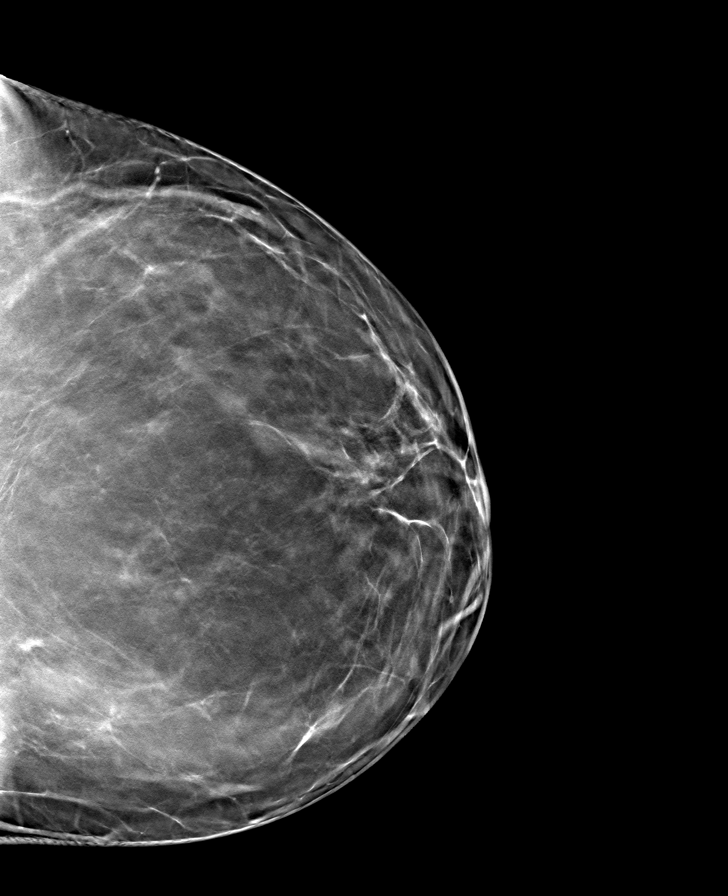

[R MLO tomo · tomo slice 51/100.0]
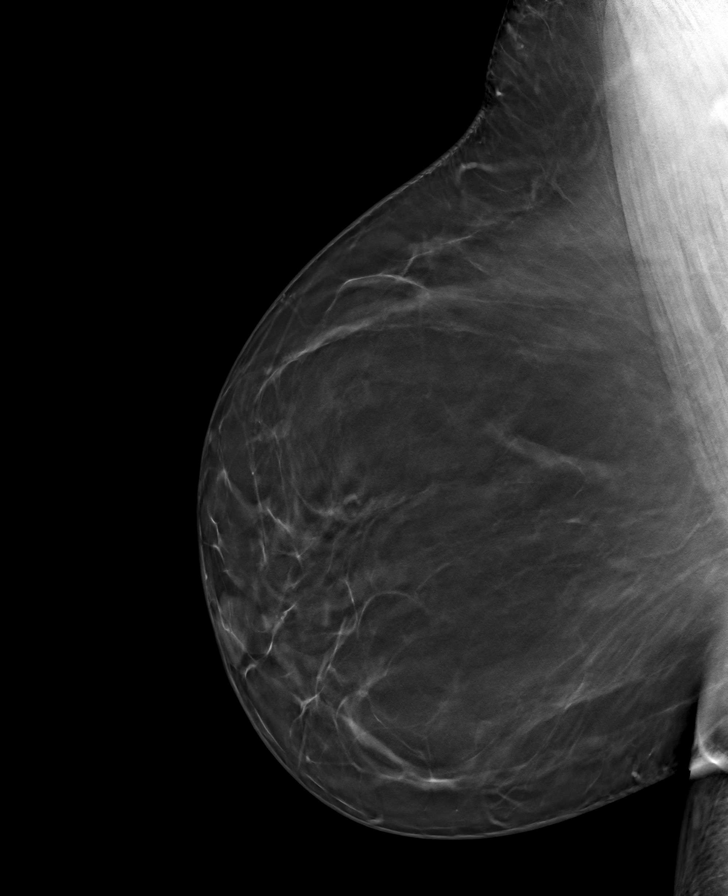

[R CC tomo · tomo slice 45/90.0]
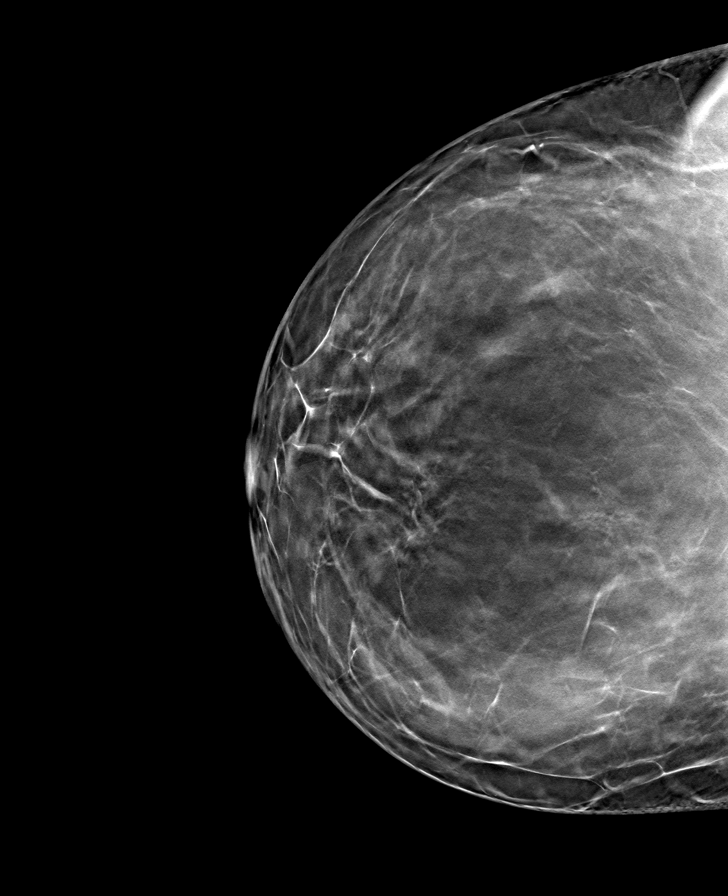

[L MLO tomo · tomo slice 46/91.0]
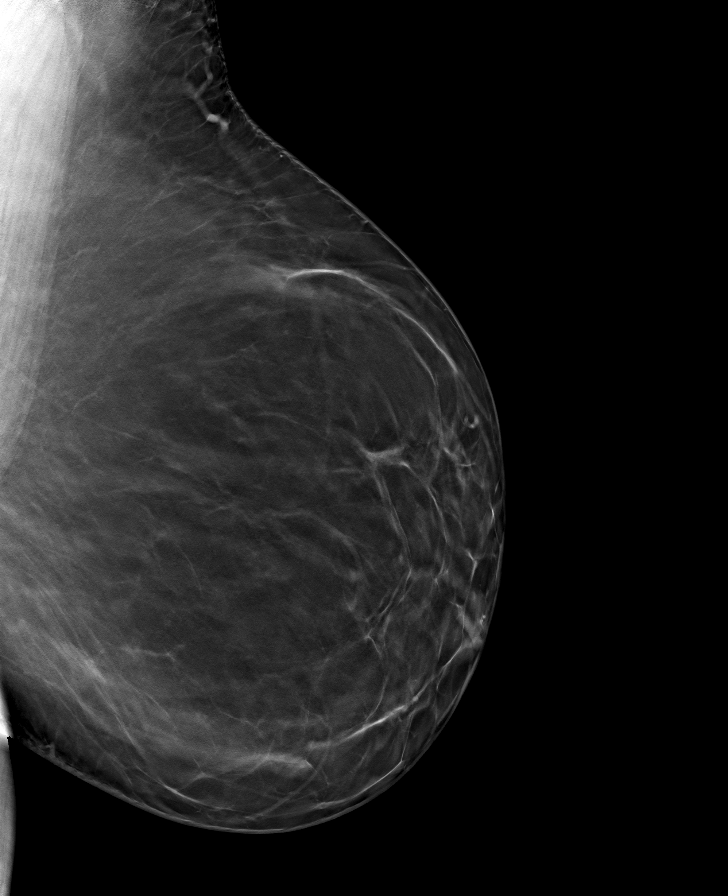

[8 of 24 positions shown; findings below may reference images not displayed]

ACR Breast Density Category b: There are scattered areas of
fibroglandular density.
FINDINGS: There are no findings suspicious for malignancy. Images were
processed with CAD.
IMPRESSION: No mammographic evidence of malignancy. A result letter of this
screening mammogram will be mailed directly to the patient.

RECOMMENDATION:
Screening mammogram in one year. (Code:CN-U-775)

BI-RADS CATEGORY  1: Negative.

## 2022-03-30 ENCOUNTER — Other Ambulatory Visit: Payer: Self-pay | Admitting: Family Medicine

## 2022-03-30 DIAGNOSIS — Z1231 Encounter for screening mammogram for malignant neoplasm of breast: Secondary | ICD-10-CM

## 2022-05-20 ENCOUNTER — Ambulatory Visit
Admission: RE | Admit: 2022-05-20 | Discharge: 2022-05-20 | Disposition: A | Discharge status: Home / Self Care | Payer: BC Managed Care – PPO | Source: Ambulatory Visit | Attending: Family Medicine | Admitting: Family Medicine

## 2022-05-20 DIAGNOSIS — Z1231 Encounter for screening mammogram for malignant neoplasm of breast: Secondary | ICD-10-CM

## 2023-03-31 ENCOUNTER — Other Ambulatory Visit: Payer: Self-pay | Admitting: Family Medicine

## 2023-03-31 ENCOUNTER — Encounter: Payer: Self-pay | Admitting: Family Medicine

## 2023-03-31 DIAGNOSIS — Z1231 Encounter for screening mammogram for malignant neoplasm of breast: Secondary | ICD-10-CM

## 2023-05-10 DIAGNOSIS — R232 Flushing: Secondary | ICD-10-CM | POA: Insufficient documentation

## 2023-05-10 DIAGNOSIS — E611 Iron deficiency: Secondary | ICD-10-CM | POA: Insufficient documentation

## 2023-05-24 ENCOUNTER — Ambulatory Visit
Admission: RE | Admit: 2023-05-24 | Discharge: 2023-05-24 | Disposition: A | Discharge status: Home / Self Care | Payer: BC Managed Care – PPO | Source: Ambulatory Visit | Attending: Family Medicine | Admitting: Family Medicine

## 2023-05-24 DIAGNOSIS — Z1231 Encounter for screening mammogram for malignant neoplasm of breast: Secondary | ICD-10-CM

## 2023-09-30 ENCOUNTER — Telehealth: Admitting: Emergency Medicine

## 2023-09-30 ENCOUNTER — Encounter: Payer: Self-pay | Admitting: Emergency Medicine

## 2023-09-30 VITALS — BP 113/75 | HR 72 | Resp 16 | Ht 65.0 in | Wt 250.0 lb

## 2023-09-30 DIAGNOSIS — R1031 Right lower quadrant pain: Secondary | ICD-10-CM | POA: Diagnosis not present

## 2023-09-30 DIAGNOSIS — R109 Unspecified abdominal pain: Secondary | ICD-10-CM

## 2023-09-30 MED ORDER — ONDANSETRON HCL 4 MG PO TABS: 4.0000 mg | ORAL_TABLET | Freq: Three times a day (TID) | ORAL | 0 refills | Status: AC | PRN

## 2023-09-30 MED ORDER — TAMSULOSIN HCL 0.4 MG PO CAPS
0.4000 mg | ORAL_CAPSULE | Freq: Every day | ORAL | 0 refills | Status: AC
Start: 2023-09-30 — End: ?

## 2023-09-30 MED ORDER — IBUPROFEN 800 MG PO TABS: 800.0000 mg | ORAL_TABLET | Freq: Three times a day (TID) | ORAL | 0 refills | Status: AC | PRN

## 2023-09-30 NOTE — Patient Instructions (Signed)
 I believe you have a kidney stone and have provided the treatments to help you with that. If you are not getting better at home with this treatment plan, if you are in too much pain, please be seen in person such as at an urgent care or the ER if it is very severe. If you are not able to pee, please go to the ER.   I recommend you follow up with your primary care provider next week to discuss further care next steps.

## 2023-09-30 NOTE — Progress Notes (Signed)
 Pt presents for evaluation following ingestion of an excessive dose of their prescribed medication -pt believes she has took all meds on list 2x this a.m. -experiencing nausea, pain in right side, mild abdominal pain  -symptoms just started about 30 mins ago

## 2023-09-30 NOTE — Progress Notes (Signed)
 The patient presented for a primary care visit on 09/30/23 as a walk in. Blood pressure screening was conducted, and the result was 113/75. During the appointment, the patient reported having no SDOH insecurities.  A review of the patient's chart revealed that she does currently have a primary care provider (PCP), no future appointments were indicated. At this time, no additional support from the Health Equity team is necessary per the health equity teams protocol.

## 2023-09-30 NOTE — Progress Notes (Addendum)
 Acute Office Visit  Virtual Visit Consent:   Calvin Chura, you are scheduled for a virtual visit with a [facility] provider today.     Just as with appointments in the office, your consent must be obtained to participate.  Your consent will be active for this visit and any virtual visit you may have with one of our providers in the next 365 days.     If you have a MyChart account, a copy of this consent can be sent to you electronically.  All virtual visits are billed to your insurance company just like a traditional visit in the office.    If the connection with a video visit is poor, the visit may have to be switched to a telephone visit.  With either a video or telephone visit, we are not always able to ensure that we have a secure connection.     I need to obtain your verbal consent now.   Are you willing to proceed with your visit today?    [name] has provided verbal consent on 09/30/2023 for a virtual visit (video or telephone).   [name], NP  Date: 09/30/2023 11:51 AM  Subjective:     Patient ID: Barbara Morales, female    DOB: [birth year], 48 y.o.   MRN: [id number]  Kary Pages, connected with  Barbara Morales  ([id number], Jun 28, 1975) on 09/30/23 at 11:20 AM EDT by a video-enabled telemedicine application and verified that I am speaking with the correct person using two identifiers.  Quincy Buba , present for entirety of visit to assist with video functionality and physical examination via [employer] device.  Location: Patient: [facility] Provider: Virtual Visit Location Provider: Home Office   I discussed the limitations of evaluation and management by telemedicine and the availability of in person appointments. The patient expressed understanding and agreed to proceed.    Chief Complaint  Patient presents with   Medication Reaction    HPI Barbara Morales is a 48 y.o. who identifies as a female who was  assigned female at birth, and is being seen today for R flank and RLQ pain that started abruptly today at work. Initiall thought she had possibly taken too much of her daily medicines but has no memory of doing so, but pain/sx started about 30 minutes after taking her medicines this morning so she associated her symptoms with taking her meds. Since her meds do not cause these symptoms usually, she suspected she took them twice and didn't remember doing so.   Abrupt onset R flank pain that wraps around to RLQ. Very nauseated. During visit she vomited x1. No dysuria, no hematuria. No hx kidney stones. Pain comes in waves but never subsides.   ROS      Objective:    BP 113/75 (BP Location: Left Arm, Patient Position: Sitting, Cuff Size: Large)   Pulse 72   Resp 16   Ht 5\' 5"  (1.651 m)   Wt 250 lb (113.4 kg)   SpO2 96%   Breastfeeding No   BMI 41.60 kg/m    Physical Exam  No results found for any visits on 09/30/23.  On exam:  Patient is well-developed, well-nourished and is restless and appears to be in pain Head is normocephalic, atraumatic.  No labored breathing. Speech is clear and coherent with logical content.  Patient is alert and oriented at baseline.        Assessment & Plan:   Problem  List Items Addressed This Visit   None Visit Diagnoses       Right lower quadrant abdominal pain    -  Primary   Relevant Orders   Urinalysis   Basic metabolic panel with GFR   CBC with Differential/Platelet     Acute right flank pain           Meds ordered this encounter  Medications   ondansetron  (ZOFRAN ) 4 MG tablet    Sig: Take 1 tablet (4 mg total) by mouth every 8 (eight) hours as needed for nausea or vomiting.    Dispense:  20 tablet    Refill:  0   tamsulosin  (FLOMAX ) 0.4 MG CAPS capsule    Sig: Take 1 capsule (0.4 mg total) by mouth daily.    Dispense:  15 capsule    Refill:  0   ibuprofen  (ADVIL ) 800 MG tablet    Sig: Take 1 tablet (800 mg total) by mouth  every 8 (eight) hours as needed.    Dispense:  30 tablet    Refill:  0    I suspect kidney stone. Discussed with pt labs not back until tomorrow, I will contact her if they are abnormal. She is to attempt home management but if pain is severe or nausea is severe and not manageable at home, or if she cannot pee, she is to see in person care.   No follow-ups on file.  Follow Up Instructions: I discussed the assessment and treatment plan with the patient. The patient was provided an opportunity to ask questions and all were answered. The patient agreed with the plan and demonstrated an understanding of the instructions.  A copy of instructions were sent to the patient via MyChart unless otherwise noted below.   The patient was advised to call back or seek an in-person evaluation if the symptoms worsen or if the condition fails to improve as anticipated.    [name], NP  **Disclaimer: This note may have been dictated with voice recognition software. Similar sounding words can inadvertently be transcribed and this note may contain transcription errors which may not have been corrected upon publication of note.**

## 2023-10-01 LAB — URINALYSIS
Bilirubin, UA: NEGATIVE
Glucose, UA: NEGATIVE
Ketones, UA: NEGATIVE
Leukocytes,UA: NEGATIVE
Nitrite, UA: NEGATIVE
RBC, UA: NEGATIVE
Specific Gravity, UA: 1.025 (ref 1.005–1.030)
Urobilinogen, Ur: 1 mg/dL (ref 0.2–1.0)
pH, UA: 5.5 (ref 5.0–7.5)

## 2023-10-01 LAB — CBC WITH DIFFERENTIAL/PLATELET
Basophils Absolute: 0.1 10*3/uL (ref 0.0–0.2)
Basos: 1 %
EOS (ABSOLUTE): 0.2 10*3/uL (ref 0.0–0.4)
Eos: 2 %
Hematocrit: 39.4 % (ref 34.0–46.6)
Hemoglobin: 12.1 g/dL (ref 11.1–15.9)
Immature Grans (Abs): 0 10*3/uL (ref 0.0–0.1)
Immature Granulocytes: 1 %
Lymphocytes Absolute: 2.6 10*3/uL (ref 0.7–3.1)
Lymphs: 31 %
MCH: 25 pg — ABNORMAL LOW (ref 26.6–33.0)
MCHC: 30.7 g/dL — ABNORMAL LOW (ref 31.5–35.7)
MCV: 81 fL (ref 79–97)
Monocytes Absolute: 0.6 10*3/uL (ref 0.1–0.9)
Monocytes: 7 %
Neutrophils Absolute: 4.8 10*3/uL (ref 1.4–7.0)
Neutrophils: 58 %
Platelets: 371 10*3/uL (ref 150–450)
RBC: 4.84 x10E6/uL (ref 3.77–5.28)
RDW: 15.9 % — ABNORMAL HIGH (ref 11.7–15.4)
WBC: 8.2 10*3/uL (ref 3.4–10.8)

## 2023-10-01 LAB — BASIC METABOLIC PANEL WITH GFR
BUN/Creatinine Ratio: 8 — ABNORMAL LOW (ref 9–23)
BUN: 9 mg/dL (ref 6–24)
CO2: 21 mmol/L (ref 20–29)
Calcium: 9.3 mg/dL (ref 8.7–10.2)
Chloride: 103 mmol/L (ref 96–106)
Creatinine, Ser: 1.08 mg/dL — ABNORMAL HIGH (ref 0.57–1.00)
Glucose: 97 mg/dL (ref 70–99)
Potassium: 4.2 mmol/L (ref 3.5–5.2)
Sodium: 140 mmol/L (ref 134–144)
eGFR: 63 mL/min/{1.73_m2} (ref >59)

## 2023-10-03 ENCOUNTER — Encounter: Payer: Self-pay | Admitting: Emergency Medicine

## 2023-12-16 NOTE — Progress Notes (Signed)
 The patient presented for a primary care visit on 09/30/2023 to establish care. Blood pressure screening was conducted, and the result was 113/75. During the appointment, the patient reported no SDOH concerns.   A review of the patient's chart revealed that she does currently have a primary care provider and no future appointments were indicated.   At this time, no additional support from the Health Equity team is necessary.

## 2024-03-20 ENCOUNTER — Other Ambulatory Visit: Payer: Self-pay | Admitting: Family Medicine

## 2024-03-20 DIAGNOSIS — Z1231 Encounter for screening mammogram for malignant neoplasm of breast: Secondary | ICD-10-CM

## 2024-05-25 ENCOUNTER — Inpatient Hospital Stay: Admission: RE | Admit: 2024-05-25 | Discharge: 2024-05-25 | Attending: Family Medicine | Admitting: Family Medicine

## 2024-05-25 DIAGNOSIS — Z1231 Encounter for screening mammogram for malignant neoplasm of breast: Secondary | ICD-10-CM
